# Patient Record
Sex: Female | Born: 1945 | Race: Black or African American | Hispanic: No | Marital: Single | Smoking: Former smoker
Health system: Southern US, Community
[De-identification: ages and names within clinical notes are randomized; demographics above are authoritative.]

## PROBLEM LIST (undated history)

## (undated) DIAGNOSIS — M1990 Unspecified osteoarthritis, unspecified site: Secondary | ICD-10-CM

## (undated) DIAGNOSIS — I10 Essential (primary) hypertension: Secondary | ICD-10-CM

## (undated) DIAGNOSIS — E785 Hyperlipidemia, unspecified: Secondary | ICD-10-CM

## (undated) DIAGNOSIS — D72819 Decreased white blood cell count, unspecified: Secondary | ICD-10-CM

## (undated) DIAGNOSIS — G709 Myoneural disorder, unspecified: Secondary | ICD-10-CM

## (undated) DIAGNOSIS — E079 Disorder of thyroid, unspecified: Secondary | ICD-10-CM

## (undated) HISTORY — DX: Unspecified osteoarthritis, unspecified site: M19.90

## (undated) HISTORY — DX: Myoneural disorder, unspecified: G70.9

## (undated) HISTORY — DX: Decreased white blood cell count, unspecified: D72.819

## (undated) HISTORY — DX: Disorder of thyroid, unspecified: E07.9

## (undated) HISTORY — DX: Hyperlipidemia, unspecified: E78.5

## (undated) HISTORY — DX: Essential (primary) hypertension: I10

---

## 2000-05-22 ENCOUNTER — Other Ambulatory Visit: Admission: RE | Admit: 2000-05-22 | Discharge: 2000-05-22 | Payer: Self-pay | Admitting: Internal Medicine

## 2000-07-27 ENCOUNTER — Encounter: Payer: Self-pay | Admitting: Internal Medicine

## 2000-07-27 ENCOUNTER — Ambulatory Visit (HOSPITAL_COMMUNITY): Admission: RE | Admit: 2000-07-27 | Discharge: 2000-07-27 | Payer: Self-pay | Admitting: Internal Medicine

## 2001-06-07 ENCOUNTER — Emergency Department (HOSPITAL_COMMUNITY): Admission: EM | Admit: 2001-06-07 | Discharge: 2001-06-07 | Payer: Self-pay | Admitting: Emergency Medicine

## 2001-09-05 ENCOUNTER — Ambulatory Visit (HOSPITAL_COMMUNITY): Admission: RE | Admit: 2001-09-05 | Discharge: 2001-09-05 | Payer: Self-pay | Admitting: Gastroenterology

## 2001-09-11 ENCOUNTER — Ambulatory Visit (HOSPITAL_COMMUNITY): Admission: RE | Admit: 2001-09-11 | Discharge: 2001-09-11 | Payer: Self-pay | Admitting: Gastroenterology

## 2001-09-11 ENCOUNTER — Encounter: Payer: Self-pay | Admitting: Gastroenterology

## 2005-06-05 HISTORY — PX: CARPAL TUNNEL RELEASE: SHX101

## 2007-01-15 ENCOUNTER — Ambulatory Visit (HOSPITAL_COMMUNITY): Admission: RE | Admit: 2007-01-15 | Discharge: 2007-01-15 | Payer: Self-pay | Admitting: Internal Medicine

## 2007-05-08 ENCOUNTER — Encounter: Admission: RE | Admit: 2007-05-08 | Discharge: 2007-05-08 | Payer: Self-pay | Admitting: Gastroenterology

## 2007-06-06 HISTORY — PX: KNEE ARTHROSCOPY: SHX127

## 2008-01-18 ENCOUNTER — Emergency Department (HOSPITAL_COMMUNITY): Admission: EM | Admit: 2008-01-18 | Discharge: 2008-01-19 | Payer: Self-pay | Admitting: Emergency Medicine

## 2010-06-25 ENCOUNTER — Encounter: Payer: Self-pay | Admitting: Internal Medicine

## 2010-10-21 NOTE — Procedures (Signed)
[place]  Patient:    Suzanne Neal, Suzanne Neal Visit Number: [id number] MRN: [id number]          Service Type: END Location: ENDO Attending Physician:  Charna Elizabeth Dictated by:   Anselmo Rod, M.D. Proc. Date: 09/05/01 Admit Date:  09/05/2001 Discharge Date: 09/05/2001   CC:         Velna Hatchet, M.D.   Procedure Report  DATE OF BIRTH:  Feb 18, 1946.  PROCEDURE:  Colonoscopy up to the hepatic flexure.  ENDOSCOPIST:  Anselmo Rod, M.D.  INSTRUMENT USED:  Olympus video colonoscope.  INDICATION FOR PROCEDURE:  Rectal bleeding in a 65 year old African-American female.  Rule out colonic polyps, masses, hemorrhoids, etc.  PREPROCEDURE PREPARATION:  Informed consent was procured from the patient. The patient was fasted for eight hours prior to the procedure and prepped with a bottle of magnesium citrate and a gallon of NuLytely the night prior to the procedure.  PREPROCEDURE PHYSICAL:  VITAL SIGNS:  The patient had stable vital signs.  NECK:  Supple.  CHEST:  Clear to auscultation.  S1, S2 regular.  ABDOMEN:  Soft with normal bowel sounds.  DESCRIPTION OF PROCEDURE:  The patient was placed in the left lateral decubitus position and sedated with 50 mg of Demerol and 5 mg of Versed intravenously.  Once the patient was adequately sedate and maintained on low-flow oxygen and continuous cardiac monitoring, the Olympus video colonoscope was advanced from the rectum to the hepatic flexure with extreme difficulty.  The patient had a large amount of residual stool in the colon. Multiple washes were done, the patients position was changed from the left lateral to the supine and then right lateral position to adequately visualize the colonic mucosa but in spite of multiple efforts and gentle application of pressure on the anterior abdominal wall, the scope could not be advanced up to the cecum.  The procedure was aborted after several  attempts to do so.  We plan to do an ACBE.  IMPRESSION: 1. Incomplete colonoscopy secondary to a poor prep and patients body habitus. 2. Prominent internal and external hemorrhoids. 3. No masses or polyps seen. 4. Procedure completed up to the hepatic flexure, no evidence of    diverticulosis. 5. Right colon and cecum not visualized.  RECOMMENDATIONS: 1. Air-contrast barium enema will be scheduled after repeat prep. 2. A high-fiber diet has been recommended. 3. Anusol-HC 2.5% suppositories one p.r. q.h.s., #30 have been prescribed. 4. Outpatient follow-up in the next two weeks. Dictated by:   Anselmo Rod, M.D. Attending Physician:  Charna Elizabeth DD:  09/05/01 TD:  09/05/01 Job: [id number]

## 2010-12-01 ENCOUNTER — Other Ambulatory Visit: Payer: Self-pay | Admitting: Nurse Practitioner

## 2010-12-01 DIAGNOSIS — M542 Cervicalgia: Secondary | ICD-10-CM

## 2010-12-02 ENCOUNTER — Other Ambulatory Visit: Payer: Self-pay | Admitting: Internal Medicine

## 2010-12-02 DIAGNOSIS — M542 Cervicalgia: Secondary | ICD-10-CM

## 2010-12-14 ENCOUNTER — Ambulatory Visit
Admission: RE | Admit: 2010-12-14 | Discharge: 2010-12-14 | Disposition: A | Discharge status: Home / Self Care | Payer: Self-pay | Source: Ambulatory Visit | Attending: Internal Medicine | Admitting: Internal Medicine

## 2010-12-14 DIAGNOSIS — M542 Cervicalgia: Secondary | ICD-10-CM

## 2010-12-14 MED ORDER — GADOBENATE DIMEGLUMINE 529 MG/ML IV SOLN
20.0000 mL | Freq: Once | INTRAVENOUS | Status: AC | PRN
  Administered 2010-12-14: 20 mL via INTRAVENOUS

## 2011-08-09 ENCOUNTER — Telehealth: Payer: Self-pay | Admitting: Hematology and Oncology

## 2011-08-09 NOTE — Telephone Encounter (Signed)
lmonvm for pt on both cell/home phones re calling me for appt w/LO.

## 2011-08-14 ENCOUNTER — Telehealth: Payer: Self-pay | Admitting: Hematology and Oncology

## 2011-08-14 NOTE — Telephone Encounter (Signed)
Referred by Bebe Liter, [facility] Dx-Dec WBC

## 2011-08-16 ENCOUNTER — Telehealth: Payer: Self-pay | Admitting: Hematology and Oncology

## 2011-08-16 ENCOUNTER — Ambulatory Visit (HOSPITAL_BASED_OUTPATIENT_CLINIC_OR_DEPARTMENT_OTHER): Payer: BC Managed Care – PPO | Admitting: Lab

## 2011-08-16 ENCOUNTER — Ambulatory Visit: Payer: Self-pay

## 2011-08-16 ENCOUNTER — Ambulatory Visit (HOSPITAL_BASED_OUTPATIENT_CLINIC_OR_DEPARTMENT_OTHER): Payer: BC Managed Care – PPO | Admitting: Hematology and Oncology

## 2011-08-16 ENCOUNTER — Encounter: Payer: Self-pay | Admitting: Hematology and Oncology

## 2011-08-16 VITALS — BP 142/79 | HR 71 | Temp 97.5°F | Ht 66.5 in | Wt 226.0 lb

## 2011-08-16 DIAGNOSIS — M129 Arthropathy, unspecified: Secondary | ICD-10-CM

## 2011-08-16 DIAGNOSIS — D72819 Decreased white blood cell count, unspecified: Secondary | ICD-10-CM | POA: Insufficient documentation

## 2011-08-16 DIAGNOSIS — M1990 Unspecified osteoarthritis, unspecified site: Secondary | ICD-10-CM

## 2011-08-16 LAB — COMPREHENSIVE METABOLIC PANEL
Alkaline Phosphatase: 65 U/L (ref 39–117)
Creatinine, Ser: 0.88 mg/dL (ref 0.50–1.10)
Glucose, Bld: 96 mg/dL (ref 70–99)
Sodium: 142 mEq/L (ref 135–145)
Total Bilirubin: 0.4 mg/dL (ref 0.3–1.2)
Total Protein: 7.3 g/dL (ref 6.0–8.3)

## 2011-08-16 LAB — CBC WITH DIFFERENTIAL/PLATELET
BASO%: 0.4 % (ref 0.0–2.0)
Eosinophils Absolute: 0.1 10*3/uL (ref 0.0–0.5)
LYMPH%: 34.3 % (ref 14.0–49.7)
MCHC: 32.2 g/dL (ref 31.5–36.0)
MCV: 87.4 fL (ref 79.5–101.0)
MONO%: 7.1 % (ref 0.0–14.0)
NEUT%: 55.6 % (ref 38.4–76.8)
Platelets: 146 10*3/uL (ref 145–400)
RBC: 4.7 10*6/uL (ref 3.70–5.45)

## 2011-08-16 LAB — VITAMIN B12: Vitamin B-12: 486 pg/mL (ref 211–911)

## 2011-08-16 NOTE — Patient Instructions (Signed)
Patient to follow up as instructed.   No current outpatient prescriptions on file.        March 2013  Sunday Monday Tuesday Wednesday Thursday Friday Saturday                  1   2    3   4   5   6   7   8   9    10   11   12   13    FINANCIAL COUNSELING   1:00 PM  (30 min.)  [facility] Artist  [facility]   NEW PATIENT 60   1:30 PM  (60 min.)  Laurice Record, MD  [facility]COL[facility]   LAB ADDON   1:45 PM  (15 min.)  Delcie Roch  [facility]COL[facility] 14   15   16    17   18   19   20   21   22   23    24   25   26   27   28   29   30     31

## 2011-08-16 NOTE — Telephone Encounter (Signed)
appt made and printed for 05/08/12

## 2011-08-16 NOTE — Progress Notes (Signed)
This office note has been dictated.

## 2011-08-16 NOTE — Progress Notes (Signed)
Dr.     Maggie Font       -     Primary. Dr/    Nadyne Coombes      -       Opthalmologist.  [facility]   On   [address].  Cell     Phone       [phone].

## 2011-08-16 NOTE — Progress Notes (Signed)
CC:   Robyn N. Allyne Gee, M.D.  IDENTIFYING STATEMENT:  The patient is a 66 year old woman seen at the request of Dr. Allyne Gee with leukopenia.  HISTORY OF PRESENT ILLNESS:  The patient notes that during a recent physical, she was felt to be leukopenic.  She in general had been in very good health except for arthritis involving her neck and knees. She is status post left arthroscopic knee surgery.  She denied history of fever, chills, or night sweats.  She denies adenopathy.  Her recent thyroid function testing was unremarkable.  She has not lost any weight. She reports eating a well-balanced diet.  She denies family history for leukopenia.  She is of African American descent.  We reviewed recent labs from Dr. Allyne Gee office, which notes:  07/05/2011 white cell count 2.9, hemoglobin 13.4, hematocrit 41.5, platelets 170; 07/11/2011 white cell count 3.5, hemoglobin 13.1, hematocrit 40.7, platelets 169.  PAST MEDICAL HISTORY:  Asthma, depression, arthritis, status post left arthroscopic knee surgery.  ALLERGIES:  None.  MEDICATIONS:  Albuterol MDI p.r.n., albuterol nebs p.r.n., Lexapro 10 mg daily, ibuprofen 800 mg 2 to 3 a week, aspirin 81 mg every other day, omega-3 1000 mg daily, co-enzyme Q 500 mg daily, green coffee.  SOCIAL HISTORY:  The patient is divorced with 3 children.  She denies alcohol or tobacco use.  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  FAMILY HISTORY:  Father had bladder cancer.  There is no family history for leukopenia.  She denies hematological malignancies.  HEALTH MAINTENANCE:  Is due for colonoscopy and mammograms.  REVIEW OF SYSTEMS:  As above, but denies fever, chills, night sweats, anorexia, weight loss.  Musculoskeletal:  Generalized joint aches, especially in the neck and knee.  Cardiovascular:  Denies chest pain, PND, orthopnea, ankle swelling.  Respirations:  Gets short of breath on extreme exertion with wheeze, is asthmatic.  Denies cough or  hemoptysis. GI:  Denies nausea, vomiting, abdominal pain, diarrhea, melena, hematochezia.  GU:  Denies dysuria, hematuria, nocturia, frequency. Skin:  No bruising or bleeding.  Neurologic:  Denies headaches, vision change, extremity weakness.  PHYSICAL EXAMINATION:  General:  The patient is a well-appearing, well- nourished woman in no current distress.  Vitals:  Pulse 71, blood pressure 142/79, temperature 97.5, respirations 20, weight 226 pounds. HEENT:  Head is atraumatic, normocephalic.  Sclerae are anicteric. Mouth moist.  Neck:  Supple.  Chest:  Clear.  CVS:  Unremarkable. Abdomen:  Soft and nontender.  Bowel sounds present.  Extremities:  No edema.  Lymph Nodes:  No adenopathy.  CNS:  Nonfocal.  IMPRESSION AND PLAN:  The patient is a healthy 66 year old woman evaluated for leukopenia.  I had the patient return to the lab to have CBC repeated and white cell count was 3.8, hemoglobin 13.2, hematocrit 41, platelets 146, and ANC was 2100.  Differential was unremarkable. Morphology was also unremarkable.  Patient was reassured and was told that she had adequate neutrophils.  It is probable that her lowish white cell count of a few weeks ago was probably due to margination.  She is of African American descent.  She also has a background history of arthritis, which can often cause leukopenia.  We also discussed the requirement for a well- balanced diet.  I will also check a vitamin B12 level and call her with results.  We also discussed the need for a colonoscopy and mammogram, which she plans to schedule in the upcoming weeks.  I spent more than half the time  in discussion and coordination of care with plans for her to follow up with a CBC at the end of the year or sooner if needed.    ______________________________ Laurice Record, M.D. LIO/MEDQ  D:  08/16/2011  T:  08/16/2011  Job:  [id number]

## 2011-08-17 ENCOUNTER — Telehealth: Payer: Self-pay | Admitting: *Deleted

## 2011-08-17 ENCOUNTER — Other Ambulatory Visit: Payer: Self-pay | Admitting: Internal Medicine

## 2011-08-17 DIAGNOSIS — Z1231 Encounter for screening mammogram for malignant neoplasm of breast: Secondary | ICD-10-CM

## 2011-08-17 NOTE — Telephone Encounter (Signed)
Spoke with pt on cell phone and informed pt re:   Vit B12 level  OK  At  486  As per md's instructions.

## 2011-08-25 ENCOUNTER — Ambulatory Visit
Admission: RE | Admit: 2011-08-25 | Discharge: 2011-08-25 | Disposition: A | Discharge status: Home / Self Care | Payer: BC Managed Care – PPO | Source: Ambulatory Visit | Attending: Internal Medicine | Admitting: Internal Medicine

## 2011-08-25 DIAGNOSIS — Z1231 Encounter for screening mammogram for malignant neoplasm of breast: Secondary | ICD-10-CM

## 2012-05-07 ENCOUNTER — Telehealth: Payer: Self-pay | Admitting: *Deleted

## 2012-05-07 ENCOUNTER — Other Ambulatory Visit: Payer: Self-pay | Admitting: *Deleted

## 2012-05-07 NOTE — Telephone Encounter (Signed)
Pt called and left message re:  Pt will not be able to keep f/u appt on 05/08/12 due to obligation at work.  Pt would like to reschedule f/u appt to any days but Wednesdays due to work schedule. Pt's  Work  IT sales professional   ;    [employer]    [phone].

## 2012-05-07 NOTE — Telephone Encounter (Signed)
Left voice message on [phone] to inform the patient that the follow up has been changed to 06-07-2012 starting at 2:15pm

## 2012-05-08 ENCOUNTER — Ambulatory Visit: Payer: BC Managed Care – PPO | Admitting: Hematology and Oncology

## 2012-05-08 ENCOUNTER — Other Ambulatory Visit: Payer: BC Managed Care – PPO | Admitting: Lab

## 2012-05-25 ENCOUNTER — Telehealth: Payer: Self-pay | Admitting: Oncology

## 2012-05-25 NOTE — Telephone Encounter (Signed)
S/w the pt and she is aware of her change in the physican from dr odogwu to dr Arline Asp due to the md is leaving the practice. She is aware of her new revised appt d/t.

## 2012-06-03 ENCOUNTER — Other Ambulatory Visit: Payer: Self-pay | Admitting: Oncology

## 2012-06-07 ENCOUNTER — Ambulatory Visit (HOSPITAL_BASED_OUTPATIENT_CLINIC_OR_DEPARTMENT_OTHER): Payer: BC Managed Care – PPO | Admitting: Family

## 2012-06-07 ENCOUNTER — Other Ambulatory Visit (HOSPITAL_BASED_OUTPATIENT_CLINIC_OR_DEPARTMENT_OTHER): Payer: BC Managed Care – PPO | Admitting: Lab

## 2012-06-07 ENCOUNTER — Ambulatory Visit: Payer: BC Managed Care – PPO | Admitting: Hematology and Oncology

## 2012-06-07 ENCOUNTER — Encounter: Payer: Self-pay | Admitting: Family

## 2012-06-07 ENCOUNTER — Telehealth: Payer: Self-pay | Admitting: Oncology

## 2012-06-07 VITALS — BP 135/75 | HR 78 | Temp 96.9°F | Resp 20 | Ht 66.5 in | Wt 219.1 lb

## 2012-06-07 DIAGNOSIS — D72819 Decreased white blood cell count, unspecified: Secondary | ICD-10-CM

## 2012-06-07 LAB — CBC WITH DIFFERENTIAL/PLATELET
Basophils Absolute: 0.1 10*3/uL (ref 0.0–0.1)
EOS%: 2.3 % (ref 0.0–7.0)
Eosinophils Absolute: 0.1 10*3/uL (ref 0.0–0.5)
HCT: 39.9 % (ref 34.8–46.6)
HGB: 13.1 g/dL (ref 11.6–15.9)
MCH: 28.8 pg (ref 25.1–34.0)
MONO#: 0.3 10*3/uL (ref 0.1–0.9)
NEUT#: 2.9 10*3/uL (ref 1.5–6.5)
NEUT%: 55.5 % (ref 38.4–76.8)
lymph#: 1.8 10*3/uL (ref 0.9–3.3)

## 2012-06-07 LAB — BASIC METABOLIC PANEL (CC13)
BUN: 14 mg/dL (ref 7.0–26.0)
CO2: 29 mEq/L (ref 22–29)
Chloride: 106 mEq/L (ref 98–107)
Creatinine: 0.9 mg/dL (ref 0.6–1.1)
Glucose: 109 mg/dl — ABNORMAL HIGH (ref 70–99)
Potassium: 4 mEq/L (ref 3.5–5.1)

## 2012-06-07 NOTE — Telephone Encounter (Signed)
Gave pt appt for June 2014 lab and MD °

## 2012-06-07 NOTE — Patient Instructions (Addendum)
Please contact us at [phone] if you have any questions or concerns.  Please schedule and complete colonoscopy before next office visit.  Results for orders placed in visit on 06/07/12 (from the past 24 hour(s))  CBC WITH DIFFERENTIAL     Status: Normal   Collection Time   06/07/12  3:25 PM      Component Value Range   WBC 5.2  3.9 - 10.3 10e3/uL   NEUT# 2.9  1.5 - 6.5 10e3/uL   HGB 13.1  11.6 - 15.9 g/dL   HCT 40.9  81.1 - 91.4 %   Platelets 148  145 - 400 10e3/uL   MCV 87.6  79.5 - 101.0 fL   MCH 28.8  25.1 - 34.0 pg   MCHC 32.8  31.5 - 36.0 g/dL   RBC 7.82  9.56 - 2.13 10e6/uL   RDW 13.5  11.2 - 14.5 %   lymph# 1.8  0.9 - 3.3 10e3/uL   MONO# 0.3  0.1 - 0.9 10e3/uL   Eosinophils Absolute 0.1  0.0 - 0.5 10e3/uL   Basophils Absolute 0.1  0.0 - 0.1 10e3/uL   NEUT% 55.5  38.4 - 76.8 %   LYMPH% 34.8  14.0 - 49.7 %   MONO% 6.3  0.0 - 14.0 %   EOS% 2.3  0.0 - 7.0 %   BASO% 1.1  0.0 - 2.0 %   Narrative:    Performed At:  [facility]               [address]               [place], [place] [address]  BASIC METABOLIC PANEL (CC13)     Status: Abnormal   Collection Time   06/07/12  3:25 PM      Component Value Range   Sodium 145  136 - 145 mEq/L   Potassium 4.0  3.5 - 5.1 mEq/L   Chloride 106  98 - 107 mEq/L   CO2 29  22 - 29 mEq/L   Glucose 109 (*) 70 - 99 mg/dl   BUN 84.6  7.0 - 96.2 mg/dL   Creatinine 0.9  0.6 - 1.1 mg/dL   Calcium 9.1  8.4 - 95.2 mg/dL   Narrative:    Performed At:  [facility]               [address]               [place], [place] [address]

## 2012-06-07 NOTE — Progress Notes (Signed)
Patient ID: Suzanne Neal, female   DOB: [birth year], 67 y.o.   MRN: [id number] CSN: [id number]  CC: Robyn N. Allyne Gee, M.D.   Problem List: Suzanne Neal is a 67 y.o. African-American female with a problem list consisting of:  1.  Leukopenia - diagnosed in March 2013 2.  Arthritis 3.  Asthma 4.  Depression 5.  S/p left knee arthroscopy  Suzanne Neal is a 67 year-old [place] American female who presents today for follow-up of a history of leukopenia.  Dr. Dalene Carrow originally saw this patient on 08/16/2011 at the request of Dr. Allyne Gee for leukopenia.  Since her last office visit on 08/16/2011, the patient states that she has been doing well.  She stated that she had a sore throat, post-nasal drip and bilateral swollen cervical lymph nodes around 05/24/2012.  Suzanne Neal stated that she took garlic in excess during the times of her symptoms and her symptoms resolved in one day.  It was requested by Dr. Dalene Carrow during their last office visit that the patient obtain a mammogram and a colonoscopy.  The patient did obtain a mammogram on 08/25/2011 (no mammographic evidence of malignancy), but has not contacted anyone about obtaining a colonoscopy.   Her last colonoscopy was on 09/11/2001. The patient denies any other symptomatology and states that she feels great.  She is very active in line dancing classes and she is a Public house manager at [employer].   Past Medical History: Past Medical History  Diagnosis Date  . Leukopenia     Surgical History: Past Surgical History  Procedure Date  . Knee arthroscopy 2009    Left knee  . Carpal tunnel release 2007    Bilaterally    Current Medications: Current Outpatient Prescriptions  Medication Sig Dispense Refill  . albuterol (PROVENTIL HFA;VENTOLIN HFA) 108 (90 BASE) MCG/ACT inhaler Inhale 1 puff into the lungs as needed.      Marland Kitchen albuterol (PROVENTIL) (2.5 MG/3ML) 0.083% nebulizer solution Take 2.5 mg by nebulization as needed.      Marland Kitchen aspirin 81 MG tablet Take 81  mg by mouth every other day.      Marland Kitchen co-enzyme Q-10 50 MG capsule Take 50 mg by mouth daily.      Marland Kitchen escitalopram (LEXAPRO) 10 MG tablet Take 10 mg by mouth daily.      . folic acid (FOLVITE) 1 MG tablet Take 1 mg by mouth daily.      Marland Kitchen ibuprofen (ADVIL,MOTRIN) 800 MG tablet Take 800 mg by mouth as directed. Take  2 - 3  Times per week.      . Linaclotide (LINZESS) 145 MCG CAPS Take 145 mcg by mouth once a week.      . omega-3 acid ethyl esters (LOVAZA) 1 G capsule Take 1 g by mouth daily.      Marland Kitchen OVER THE COUNTER MEDICATION Take by mouth daily. Mangosteen  Juice.      . rosuvastatin (CRESTOR) 10 MG tablet Take 10 mg by mouth daily.      . Vitamin D, Ergocalciferol, (DRISDOL) 50000 UNITS CAPS Take 50,000 Units by mouth 2 (two) times a week.      . Zinc 50 MG CAPS Take 50 mg by mouth daily.        Allergies: No Known Allergies  Family History: Family History  Problem Relation Age of Onset  . Diabetes Mother   . Dementia Mother   . Sarcoidosis Maternal Grandmother   . Rheum arthritis Maternal Grandmother  Social History: History  Substance Use Topics  . Smoking status: Former Smoker -- 29 years    Types: Cigarettes    Quit date: 08/16/1983  . Smokeless tobacco: Never Used  . Alcohol Use: No    Review of Systems: 10 Point review of systems was completed and is negative except as noted above.   Physical Exam:   Blood pressure 135/75, pulse 78, temperature 96.9 F (36.1 C), temperature source Oral, resp. rate 20, height 5' 6.5" (1.689 m), weight 219 lb 1.6 oz (99.383 kg).  General appearance: Alert, cooperative, well nourished, no apparent distress Head: Normocephalic, without obvious abnormality, atraumatic Eyes: Conjunctivae/corneas clear,  PERRLA, EOMI Nose: Nares, septum and mucosa are normal, no drainage or sinus tenderness Neck: No adenopathy, supple, symmetrical, trachea midline, thyroid not enlarged, no tenderness Resp: Clear to auscultation bilaterally, diminished  bibasilar breath sounds Cardio: Regular rate and rhythm, S1, S2 normal, no murmur, click, rub or gallop GI: Soft, distended, non-tender, hypoactive bowel sounds, no organomegaly Extremities: Extremities normal, atraumatic, no cyanosis or edema Lymph nodes: Cervical, supraclavicular, and axillary nodes normal Neurologic: Grossly normal   Laboratory Data: Results for orders placed in visit on 06/07/12 (from the past 48 hour(s))  CBC WITH DIFFERENTIAL     Status: Normal   Collection Time   06/07/12  3:25 PM      Component Value Range Comment   WBC 5.2  3.9 - 10.3 10e3/uL    NEUT# 2.9  1.5 - 6.5 10e3/uL    HGB 13.1  11.6 - 15.9 g/dL    HCT 16.1  09.6 - 04.5 %    Platelets 148  145 - 400 10e3/uL    MCV 87.6  79.5 - 101.0 fL    MCH 28.8  25.1 - 34.0 pg    MCHC 32.8  31.5 - 36.0 g/dL    RBC 4.09  8.11 - 9.14 10e6/uL    RDW 13.5  11.2 - 14.5 %    lymph# 1.8  0.9 - 3.3 10e3/uL    MONO# 0.3  0.1 - 0.9 10e3/uL    Eosinophils Absolute 0.1  0.0 - 0.5 10e3/uL    Basophils Absolute 0.1  0.0 - 0.1 10e3/uL    NEUT% 55.5  38.4 - 76.8 %    LYMPH% 34.8  14.0 - 49.7 %    MONO% 6.3  0.0 - 14.0 %    EOS% 2.3  0.0 - 7.0 %    BASO% 1.1  0.0 - 2.0 %   BASIC METABOLIC PANEL (CC13)     Status: Abnormal   Collection Time   06/07/12  3:25 PM      Component Value Range Comment   Sodium 145  136 - 145 mEq/L    Potassium 4.0  3.5 - 5.1 mEq/L    Chloride 106  98 - 107 mEq/L    CO2 29  22 - 29 mEq/L    Glucose 109 (*) 70 - 99 mg/dl    BUN 78.2  7.0 - 95.6 mg/dL    Creatinine 0.9  0.6 - 1.1 mg/dL    Calcium 9.1  8.4 - 21.3 mg/dL      Imaging Studies: 1.  Digital screening bilateral mammogram on 08/25/3011 showed no specific mammographic evidence of malignancy. 2.  MRI of the cervical spine w/wo contrast on 12/14/2010 showed cervical spondylosis and degenerative disc disease with varying  degrees of impingement at all levels between C3 and T1. Suspected hemangioma eccentric to the right in the C4 vertebral  body. 3.  Colonoscopy w/wo KUB on 09/11/2001 showed no persistent polypoid, ulcerative or constricting lesions are detected. Barium refluxed into the distal ileum which appears normal. The preliminary scout film of the abdomen is negative. Negative BE.   Impression/Plan: Suzanne Neal is a healthy 67 year old African-American woman who was evaluated for leukopenia. Her differential and morphology was unremarkable.   We will plan to see Suzanne Neal again in 6 months for an office visit with Dr. Arline Asp and laboratories (CMP, CBC and LDH) on 11/07/2012.  Interim blood counts will be omitted at this time.  Suzanne Neal is encouraged to contact us in the interim if she has any questions or concerns.    Larina Bras, NP-C 06/07/2012, 5:37 PM

## 2012-11-07 ENCOUNTER — Other Ambulatory Visit: Payer: BC Managed Care – PPO | Admitting: Lab

## 2012-11-07 ENCOUNTER — Ambulatory Visit: Payer: BC Managed Care – PPO | Admitting: Oncology

## 2013-03-06 ENCOUNTER — Ambulatory Visit
Admission: RE | Admit: 2013-03-06 | Discharge: 2013-03-06 | Disposition: A | Discharge status: Home / Self Care | Payer: BC Managed Care – PPO | Source: Ambulatory Visit | Attending: Nurse Practitioner | Admitting: Nurse Practitioner

## 2013-03-06 ENCOUNTER — Other Ambulatory Visit: Payer: Self-pay | Admitting: Nurse Practitioner

## 2013-03-06 DIAGNOSIS — N6452 Nipple discharge: Secondary | ICD-10-CM

## 2013-03-19 ENCOUNTER — Other Ambulatory Visit: Payer: BC Managed Care – PPO | Admitting: Sports Medicine

## 2013-04-01 ENCOUNTER — Encounter: Payer: Self-pay | Admitting: Family Medicine

## 2013-04-01 ENCOUNTER — Ambulatory Visit (INDEPENDENT_AMBULATORY_CARE_PROVIDER_SITE_OTHER): Payer: BC Managed Care – PPO | Admitting: Family Medicine

## 2013-04-01 VITALS — BP 148/84 | Ht 66.0 in | Wt 200.0 lb

## 2013-04-01 DIAGNOSIS — M7671 Peroneal tendinitis, right leg: Secondary | ICD-10-CM

## 2013-04-01 DIAGNOSIS — M7750 Other enthesopathy of unspecified foot: Secondary | ICD-10-CM

## 2013-04-01 MED ORDER — NITROGLYCERIN 0.2 MG/HR TD PT24: 1.0000 | MEDICATED_PATCH | Freq: Every day | TRANSDERMAL | Status: DC

## 2013-04-01 NOTE — Progress Notes (Signed)
CC: Right lateral foot pain HPI: Patient is a 67 year old female who I previously saw at [facility] for evaluation of persistent bilateral foot pain. Unfortunately, she suffered a avulsion fracture off the base of the fifth metatarsal back in March. She was treated in an orthopedic office for this and was in a walker boot for about a month. She was released from their care after full healing of the fracture but has had persistent pain at the area of the base of the fifth since that time. She describes the pain as burning type of pain that radiates along her fifth metatarsal. She has tried Motrin, Aleve, and Tylenol without significant relief of her symptoms. She does sometimes wear the boot at work when she is having more pain and this does help with her symptoms. She does continue to dance.  ROS: As above in the HPI. All other systems are stable or negative.  PMH: Asthma  PSH: None  Social: Patient works as a Engineer, civil (consulting) at [employer]. She does not smoke. Family: Family history positive for diabetes in her mother. Allergies: No known drug allergies    OBJECTIVE: APPEARANCE:  Patient in no acute distress.The patient appeared well nourished and normally developed. HEENT: No scleral icterus. Conjunctiva non-injected Resp: Non labored Skin: No rash MSK:  Right Foot exam:  - On inspection, there is noted to be no swelling or deformity.  - Gait is antalgic - There is tenderness to palpation over the base of the fifth metatarsal but no other tenderness to palpation.  - Ankle range of motion is full without pain. - Strength is 5 out of 5 in ankle and foot.  - Neurovascular status normal.   MSK [place]: Limited ultrasound of the right lateral foot and ankle was performed in transverse and longitudinal views. At the level of the ankle and lateral malleolus there was hypoechoic signal around the peroneal tendons consistent with tendinopathy. At the distal attachment of the peroneal tendon to the days  of the fifth metatarsal there was noted to be hyperechoic calcifications as well as hypoechoic fluid and possible partial tear at the insertion   ASSESSMENT: #1. Persistent right lateral foot pain following a avulsion type fracture at the base of the fifth metatarsal with ultrasound findings today suggestive of peroneal tendon injury at the insertion  PLAN: We will start the patient on nitroglycerin protocol. She was given a compression sleeve to wear. We have asked her to take the next month off of dancing while she recovers. She was also given some heel raises to do with toes turned out to isolate the peroneal muscles. I did explain to her that this has been a chronic problem for her over several months and I expect that the resolution will also be slow. I've encouraged her to be patient and expect a 3-6 month recovery period. I am hopeful that when we next see her back she will have some degree of improvement in her symptoms. Followup in 4 weeks.

## 2013-04-01 NOTE — Patient Instructions (Addendum)
Thank you for coming in today  You have tendinopathy of the peroneal tendon where it attaches to the base of the 5th metatarsal Try nitroglycerin patch as below Do heel raises with toes turned in No dancing for 1 month - cross train with bike, swimming, elliptical Consider compression  Nitroglycerin Protocol   Apply 1/4 nitroglycerin patch to affected area daily.  Change position of patch within the affected area every 24 hours.  You may experience a headache during the first 1-2 weeks of using the patch, these should subside.  If you experience headaches after beginning nitroglycerin patch treatment, you may take your preferred over the counter pain reliever.  Another side effect of the nitroglycerin patch is skin irritation or rash related to patch adhesive.  Please notify our office if you develop more severe headaches or rash, and stop the patch.  Tendon healing with nitroglycerin patch may require 12 to 24 weeks depending on the extent of injury.  Men should not use if taking Viagra, Cialis, or Levitra.   Do not use if you have migraines or rosacea.

## 2013-05-13 ENCOUNTER — Ambulatory Visit: Payer: BC Managed Care – PPO | Admitting: Family Medicine

## 2014-03-05 ENCOUNTER — Other Ambulatory Visit: Payer: Self-pay

## 2014-03-05 DIAGNOSIS — Z1239 Encounter for other screening for malignant neoplasm of breast: Secondary | ICD-10-CM

## 2014-03-25 ENCOUNTER — Other Ambulatory Visit: Payer: Self-pay

## 2014-03-25 DIAGNOSIS — Z1231 Encounter for screening mammogram for malignant neoplasm of breast: Secondary | ICD-10-CM

## 2014-03-27 ENCOUNTER — Ambulatory Visit: Payer: BC Managed Care – PPO

## 2014-06-24 ENCOUNTER — Emergency Department (HOSPITAL_COMMUNITY): Payer: BC Managed Care – PPO

## 2014-06-24 ENCOUNTER — Emergency Department (HOSPITAL_COMMUNITY)
Admission: EM | Admit: 2014-06-24 | Discharge: 2014-06-24 | Disposition: A | Discharge status: Home / Self Care | Payer: BC Managed Care – PPO | Attending: Emergency Medicine | Admitting: Emergency Medicine

## 2014-06-24 ENCOUNTER — Encounter (HOSPITAL_COMMUNITY): Payer: Self-pay | Admitting: Emergency Medicine

## 2014-06-24 DIAGNOSIS — Z87891 Personal history of nicotine dependence: Secondary | ICD-10-CM | POA: Diagnosis not present

## 2014-06-24 DIAGNOSIS — R079 Chest pain, unspecified: Secondary | ICD-10-CM

## 2014-06-24 DIAGNOSIS — Z862 Personal history of diseases of the blood and blood-forming organs and certain disorders involving the immune mechanism: Secondary | ICD-10-CM | POA: Insufficient documentation

## 2014-06-24 DIAGNOSIS — J45901 Unspecified asthma with (acute) exacerbation: Secondary | ICD-10-CM | POA: Insufficient documentation

## 2014-06-24 DIAGNOSIS — Z79899 Other long term (current) drug therapy: Secondary | ICD-10-CM | POA: Insufficient documentation

## 2014-06-24 DIAGNOSIS — Z8639 Personal history of other endocrine, nutritional and metabolic disease: Secondary | ICD-10-CM | POA: Insufficient documentation

## 2014-06-24 LAB — BASIC METABOLIC PANEL
Anion gap: 9 (ref 5–15)
BUN: 9 mg/dL (ref 6–23)
CO2: 27 mmol/L (ref 19–32)
Calcium: 8.8 mg/dL (ref 8.4–10.5)
Chloride: 106 mEq/L (ref 96–112)
Creatinine, Ser: 0.75 mg/dL (ref 0.50–1.10)
GFR calc Af Amer: >90 mL/min (ref >90)
GFR, EST NON AFRICAN AMERICAN: 86 mL/min — AB (ref >90)
GLUCOSE: 93 mg/dL (ref 70–99)
Potassium: 4.2 mmol/L (ref 3.5–5.1)
Sodium: 142 mmol/L (ref 135–145)

## 2014-06-24 LAB — CBC
HCT: 39.5 % (ref 36.0–46.0)
Hemoglobin: 12.4 g/dL (ref 12.0–15.0)
MCH: 27.9 pg (ref 26.0–34.0)
MCHC: 31.4 g/dL (ref 30.0–36.0)
MCV: 88.8 fL (ref 78.0–100.0)
Platelets: 140 K/uL — ABNORMAL LOW (ref 150–400)
RBC: 4.45 MIL/uL (ref 3.87–5.11)
RDW: 12.8 % (ref 11.5–15.5)
WBC: 3.5 K/uL — ABNORMAL LOW (ref 4.0–10.5)

## 2014-06-24 LAB — I-STAT TROPONIN, ED
TROPONIN I, POC: 0 ng/mL (ref 0.00–0.08)
Troponin i, poc: 0 ng/mL (ref 0.00–0.08)

## 2014-06-24 MED ORDER — OMEPRAZOLE 20 MG PO CPDR: 20.0000 mg | DELAYED_RELEASE_CAPSULE | Freq: Every day | ORAL | Status: DC

## 2014-06-24 NOTE — ED Notes (Signed)
Per [facility], pt woke up this morning with bad headache, took some alka seltzer, headache got better but started having CP, worse when ambulating, relieved at rest. 12 lead unremarkable. 324 asa 1 nitro by staff at work. Pain is relieved. No pain at this time. VSS.

## 2014-06-24 NOTE — ED Notes (Signed)
Pt transported to CT ?

## 2014-06-24 NOTE — ED Notes (Signed)
CP is in center of chest, no associated symptoms. No radiating. Described as sharp. No pain at this time.

## 2014-06-24 NOTE — ED Notes (Signed)
Pt returned from xray

## 2014-06-24 NOTE — ED Notes (Signed)
Pt ambulated to bathroom with steady gait. 

## 2014-06-24 NOTE — ED Provider Notes (Signed)
CSN: [id number]     Arrival date & time 06/24/14  1309 History   First MD Initiated Contact with Patient 06/24/14 1327     Chief Complaint  Patient presents with  . Chest Pain     (Consider location/radiation/quality/duration/timing/severity/associated sxs/prior Treatment) HPI  Suzanne Neal is a 69 y.o. female with PMH of leukopenia, dyslipidemia presenting with chest pain that started at 11:30 today. Pain is in mid chest without any radiation and is described as sharp. Patient was ambulating which worsen the pain. Chest pain relieved with rest. Patient did take 325 aspirin as well as one nitroglycerin at work which relieved the pain as well. Patient denies pain at this time. Patient denies nausea, vomiting, diaphoresis. Patient with history of asthma and reports immediate shortness of breath as well as lightheadedness that is now resolved. Patient states his shortness of breath was not as bad as asthma she's had in the past. Patient without cardiac history. No diabetes, hypertension. Patient former smoker. She denies history of DVT, PE, malignancy, recent trauma, surgery, hemoptysis.   Past Medical History  Diagnosis Date  . Leukopenia    Past Surgical History  Procedure Laterality Date  . Knee arthroscopy  2009    Left knee  . Carpal tunnel release  2007    Bilaterally   Family History  Problem Relation Age of Onset  . Diabetes Mother   . Dementia Mother   . Sarcoidosis Maternal Grandmother   . Rheum arthritis Maternal Grandmother    History  Substance Use Topics  . Smoking status: Former Smoker -- 29 years    Types: Cigarettes    Quit date: 08/16/1983  . Smokeless tobacco: Never Used  . Alcohol Use: No   OB History    No data available     Review of Systems 10 Systems reviewed and are negative for acute change except as noted in the HPI.    Allergies  Review of patient's allergies indicates no known allergies.  Home Medications   Prior to Admission medications    Medication Sig Start Date End Date Taking? Authorizing Provider  BEE POLLEN PO Take 1 tablet by mouth daily.   Yes Historical Provider, MD  chlorhexidine (PERIDEX) 0.12 % solution Use as directed 15 mLs in the mouth or throat as needed (for canker sore).   Yes Historical Provider, MD  escitalopram (LEXAPRO) 10 MG tablet Take 10 mg by mouth daily.   Yes Historical Provider, MD  fluticasone (FLONASE) 50 MCG/ACT nasal spray Place 1 spray into both nostrils as needed for allergies or rhinitis.   Yes Historical Provider, MD  Fluticasone-Salmeterol (ADVAIR) 100-50 MCG/DOSE AEPB Inhale 1 puff into the lungs as needed (for shortness of breath).   Yes Historical Provider, MD  ibuprofen (ADVIL,MOTRIN) 800 MG tablet Take 800 mg by mouth as directed. Take  2 - 3  Times per week.   Yes Historical Provider, MD  Linaclotide (LINZESS) 145 MCG CAPS Take 145 mcg by mouth once a week.   Yes Historical Provider, MD  Methylsulfonylmethane (MSM PO) Take 1 tablet by mouth as needed (for joint health).   Yes Historical Provider, MD  OVER THE COUNTER MEDICATION Take 1 tablet by mouth as needed ("Sleepsure" for sleep).   Yes Historical Provider, MD  albuterol (PROVENTIL HFA;VENTOLIN HFA) 108 (90 BASE) MCG/ACT inhaler Inhale 1 puff into the lungs as needed.    Historical Provider, MD  albuterol (PROVENTIL) (2.5 MG/3ML) 0.083% nebulizer solution Take 2.5 mg by nebulization as needed.  Historical Provider, MD  nitroGLYCERIN (NITRODUR - DOSED IN MG/24 HR) 0.2 mg/hr patch Place 1 patch (0.2 mg total) onto the skin daily. 04/01/13   Daine GipErin L Voss, MD   BP 116/59 mmHg  Pulse 69  Temp(Src) 98.3 F (36.8 C) (Oral)  Resp 19  SpO2 100% Physical Exam  Constitutional: She appears well-developed and well-nourished. No distress.  HENT:  Head: Normocephalic and atraumatic.  Eyes: Conjunctivae and EOM are normal. Right eye exhibits no discharge. Left eye exhibits no discharge.  Neck: No JVD present.  Cardiovascular: Normal rate,  regular rhythm and normal heart sounds.   No leg swelling or tenderness. Negative Homan's sign.  Pulmonary/Chest: Effort normal and breath sounds normal. No respiratory distress. She has no wheezes.  Abdominal: Soft. Bowel sounds are normal. She exhibits no distension. There is no tenderness.  Neurological: She is alert. She exhibits normal muscle tone. Coordination normal.  Skin: Skin is warm and dry. She is not diaphoretic.  Nursing note and vitals reviewed.   ED Course  Procedures (including critical care time) Labs Review Labs Reviewed  CBC - Abnormal; Notable for the following:    WBC 3.5 (*)    Platelets 140 (*)    All other components within normal limits  BASIC METABOLIC PANEL - Abnormal; Notable for the following:    GFR calc non Af Amer 86 (*)    All other components within normal limits  I-STAT TROPOININ, ED    Imaging Review Dg Chest 2 View  06/24/2014   CLINICAL DATA:  Midsternal chest pain for 1 day  EXAM: CHEST  2 VIEW  COMPARISON:  None.  FINDINGS: There is subsegmental atelectasis in the left base. Elsewhere, lungs are clear. Heart is upper normal in size with pulmonary vascularity within normal limits. No adenopathy. No bone lesions.  IMPRESSION: Mild left base atelectasis.  No edema or consolidation.   Electronically Signed   By: Bretta BangWilliam  Woodruff M.D.   On: 06/24/2014 14:03     EKG Interpretation   Date/Time:  Wednesday June 24 2014 13:12:11 EST Ventricular Rate:  72 PR Interval:  190 QRS Duration: 79 QT Interval:  406 QTC Calculation: 444 R Axis:   49 Text Interpretation:  Sinus rhythm Low voltage, precordial leads No  significant change was found Confirmed by CAMPOS  MD, KEVIN ([id number]) on  06/24/2014 3:27:09 PM      EKG Interpretation  Date/Time:  Wednesday June 24 2014 15:45:11 EST Ventricular Rate:  67 PR Interval:  168 QRS Duration: 81 QT Interval:  412 QTC Calculation: 435 R Axis:   49 Text Interpretation:  Sinus rhythm No significant  change was found Confirmed by CAMPOS  MD, Caryn BeeKEVIN ([id number]) on 06/24/2014 3:51:02 PM          MDM   Final diagnoses:  Chest pain   Chest pain is not likely of cardiac or pulmonary etiology d/t presentation, low risk Well's score, no current SOB, hypoxia, tachycardia, low risk HEART score. VSS, no JVD or new murmur, RRR, breath sounds equal bilaterally, EKG and repeat EKG with Sinus rhythm low voltage, negative troponin and delta troponin, and negative CXR. Pt has been advised to return to the ED if CP becomes exertional, associated with diaphoresis or nausea, radiates to left jaw/arm, worsens or becomes concerning in any way. Patient started on PPI. Pt to follow up with cardiology and follow up with PCP. Pt appears reliable for follow up and is agreeable to discharge.   Discussed return precautions with patient. Discussed  all results and patient verbalizes understanding and agrees with plan.  This is a shared patient. This patient was discussed with the physician, Dr. Patria Mane who saw and evaluated the patient and agrees with the plan.   Louann Sjogren, PA-C 06/24/14 1659  Lyanne Co, MD 06/25/14 [phone]

## 2014-06-24 NOTE — Discharge Instructions (Signed)
Return to the emergency room with worsening of symptoms, new symptoms or with symptoms that are concerning , especially chest pain that feels like a pressure, spreads to left arm or jaw, worse with exertion, associated with nausea, vomiting, shortness of breath and/or sweating.  Call to make an appointment with cardiology for follow up  Please call your doctor for a followup appointment within 24-48 hours. When you talk to your doctor please let them know that you were seen in the emergency department and have them acquire all of your records so that they can discuss the findings with you and formulate a treatment plan to fully care for your new and ongoing problems.   Chest Pain (Nonspecific) It is often hard to give a specific diagnosis for the cause of chest pain. There is always a chance that your pain could be related to something serious, such as a heart attack or a blood clot in the lungs. You need to follow up with your health care provider for further evaluation. CAUSES   Heartburn.  Pneumonia or bronchitis.  Anxiety or stress.  Inflammation around your heart (pericarditis) or lung (pleuritis or pleurisy).  A blood clot in the lung.  A collapsed lung (pneumothorax). It can develop suddenly on its own (spontaneous pneumothorax) or from trauma to the chest.  Shingles infection (herpes zoster virus). The chest wall is composed of bones, muscles, and cartilage. Any of these can be the source of the pain.  The bones can be bruised by injury.  The muscles or cartilage can be strained by coughing or overwork.  The cartilage can be affected by inflammation and become sore (costochondritis). DIAGNOSIS  Lab tests or other studies may be needed to find the cause of your pain. Your health care provider may have you take a test called an ambulatory electrocardiogram (ECG). An ECG records your heartbeat patterns over a 24-hour period. You may also have other tests, such as:  Transthoracic  echocardiogram (TTE). During echocardiography, sound waves are used to evaluate how blood flows through your heart.  Transesophageal echocardiogram (TEE).  Cardiac monitoring. This allows your health care provider to monitor your heart rate and rhythm in real time.  Holter monitor. This is a portable device that records your heartbeat and can help diagnose heart arrhythmias. It allows your health care provider to track your heart activity for several days, if needed.  Stress tests by exercise or by giving medicine that makes the heart beat faster. TREATMENT   Treatment depends on what may be causing your chest pain. Treatment may include:  Acid blockers for heartburn.  Anti-inflammatory medicine.  Pain medicine for inflammatory conditions.  Antibiotics if an infection is present.  You may be advised to change lifestyle habits. This includes stopping smoking and avoiding alcohol, caffeine, and chocolate.  You may be advised to keep your head raised (elevated) when sleeping. This reduces the chance of acid going backward from your stomach into your esophagus. Most of the time, nonspecific chest pain will improve within 2-3 days with rest and mild pain medicine.  HOME CARE INSTRUCTIONS   If antibiotics were prescribed, take them as directed. Finish them even if you start to feel better.  For the next few days, avoid physical activities that bring on chest pain. Continue physical activities as directed.  Do not use any tobacco products, including cigarettes, chewing tobacco, or electronic cigarettes.  Avoid drinking alcohol.  Only take medicine as directed by your health care provider.  Follow your health  care provider's suggestions for further testing if your chest pain does not go away.  Keep any follow-up appointments you made. If you do not go to an appointment, you could develop lasting (chronic) problems with pain. If there is any problem keeping an appointment, call to  reschedule. SEEK MEDICAL CARE IF:   Your chest pain does not go away, even after treatment.  You have a rash with blisters on your chest.  You have a fever. SEEK IMMEDIATE MEDICAL CARE IF:   You have increased chest pain or pain that spreads to your arm, neck, jaw, back, or abdomen.  You have shortness of breath.  You have an increasing cough, or you cough up blood.  You have severe back or abdominal pain.  You feel nauseous or vomit.  You have severe weakness.  You faint.  You have chills. This is an emergency. Do not wait to see if the pain will go away. Get medical help at once. Call your local emergency services (911 in [place] Do not drive yourself to the hospital. MAKE SURE YOU:   Understand these instructions.  Will watch your condition.  Will get help right away if you are not doing well or get worse. Document Released: 03/01/2005 Document Revised: 05/27/2013 Document Reviewed: 12/26/2007 [facility] Patient Information 2015 [place], [place]. This information is not intended to replace advice given to you by your health care provider. Make sure you discuss any questions you have with your health care provider.

## 2015-11-10 ENCOUNTER — Other Ambulatory Visit: Payer: Self-pay | Admitting: Internal Medicine

## 2015-11-10 DIAGNOSIS — Z1231 Encounter for screening mammogram for malignant neoplasm of breast: Secondary | ICD-10-CM

## 2015-11-25 ENCOUNTER — Ambulatory Visit
Admission: RE | Admit: 2015-11-25 | Discharge: 2015-11-25 | Disposition: A | Discharge status: Home / Self Care | Payer: BC Managed Care – PPO | Source: Ambulatory Visit | Attending: Internal Medicine | Admitting: Internal Medicine

## 2015-11-25 DIAGNOSIS — Z1231 Encounter for screening mammogram for malignant neoplasm of breast: Secondary | ICD-10-CM

## 2015-12-02 ENCOUNTER — Encounter: Payer: Self-pay | Admitting: Podiatry

## 2015-12-02 ENCOUNTER — Ambulatory Visit (INDEPENDENT_AMBULATORY_CARE_PROVIDER_SITE_OTHER): Payer: BC Managed Care – PPO | Admitting: Podiatry

## 2015-12-02 VITALS — BP 128/76 | HR 88 | Resp 12

## 2015-12-02 DIAGNOSIS — L600 Ingrowing nail: Secondary | ICD-10-CM

## 2015-12-02 DIAGNOSIS — M21619 Bunion of unspecified foot: Secondary | ICD-10-CM

## 2015-12-02 NOTE — Progress Notes (Signed)
Subjective:     Patient ID: Suzanne Neal, female   DOB: 1945/08/20, 70 y.o.   MRN: [id number]  HPI patient presents with ingrown toenails of both big toes and states they're making increasingly hard for her to wear shoe gear comfortably and she's tried wider shoe gear and she's tried to trim them and get pedicures   Review of Systems  All other systems reviewed and are negative.      Objective:   Physical Exam  Constitutional: She is oriented to person, place, and time.  Cardiovascular: Intact distal pulses.   Musculoskeletal: Normal range of motion.  Neurological: She is oriented to person, place, and time.  Skin: Skin is warm.  Nursing note and vitals reviewed.  neurovascular status intact muscle strength adequate range of motion within normal limits with patient found to have incurvated hallux bilateral medial borders that are painful when pressed and making shoe gear difficult. 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 Explained procedure and risk and patient wants surgery and today I infiltrated each hallux 60 mg Xylocaine Marcaine mixture remove the medial borders exposed matrix and applied phenol 3 applications 30 seconds followed by alcohol lavage and sterile dressing. Gave instructions on soaks and reappoint

## 2015-12-02 NOTE — Progress Notes (Signed)
   Subjective:    Patient ID: Suzanne Neal, female    DOB: November 25, 1945, 70 y.o.   MRN: [id number]  HPI  Chief Complaint  Patient presents with  . Ingrown Toenail    ''B/L GREAT TOENAIL ARE SORE.''   PT STATED B/L GREAT TOENAIL BEEN SORE FOR ABOUT 10 YEARS. TOENAILS ARE GETTING WORSE ESPECIALLY WHEN PUTTING PRESSURE. TRIED TRIM-NO RELIEF.  Review of Systems  Skin: Positive for color change.       Objective:   Physical Exam        Assessment & Plan:

## 2016-02-16 ENCOUNTER — Encounter: Payer: Self-pay | Admitting: Podiatry

## 2016-02-16 ENCOUNTER — Ambulatory Visit (INDEPENDENT_AMBULATORY_CARE_PROVIDER_SITE_OTHER): Payer: BC Managed Care – PPO | Admitting: Podiatry

## 2016-02-16 VITALS — BP 138/78 | HR 76 | Resp 16

## 2016-02-16 DIAGNOSIS — M79605 Pain in left leg: Secondary | ICD-10-CM

## 2016-02-16 DIAGNOSIS — L03032 Cellulitis of left toe: Secondary | ICD-10-CM | POA: Diagnosis not present

## 2016-02-16 DIAGNOSIS — B351 Tinea unguium: Secondary | ICD-10-CM

## 2016-02-16 DIAGNOSIS — M79674 Pain in right toe(s): Secondary | ICD-10-CM | POA: Diagnosis not present

## 2016-02-16 DIAGNOSIS — M79675 Pain in left toe(s): Secondary | ICD-10-CM

## 2016-02-16 DIAGNOSIS — M79604 Pain in right leg: Secondary | ICD-10-CM

## 2016-02-16 DIAGNOSIS — L03012 Cellulitis of left finger: Secondary | ICD-10-CM

## 2016-02-17 ENCOUNTER — Encounter: Payer: BC Managed Care – PPO | Admitting: Podiatry

## 2016-02-17 NOTE — Progress Notes (Signed)
Subjective:     Patient ID: Suzanne Neal, female   DOB: 04/26/1946, 70 y.o.   MRN: [id number]  HPI patient states the left big toe has been swollen with localized drainage and minimal discomfort and it is better than it was but wanted to have it checked   Review of Systems     Objective:   Physical Exam Neurovascular status intact with left hallux medial border showing a small amount of localized drainage with no proximal edema erythema or drainage noted    Assessment:     Paronychia left hallux nail medial    Plan:     H&P conditions reviewed and today I went ahead and I explained soaks therapy and Neosporin usage and if symptoms were to get worse or any increased drainage or redness were to occur patient is to let us know immediately

## 2016-05-03 ENCOUNTER — Telehealth: Payer: Self-pay | Admitting: *Deleted

## 2016-05-03 NOTE — Telephone Encounter (Addendum)
Pt states she had an ingrown toenail procedure, and it heals then opens up with pus about every month. Pt states she got my message Monday to get an appt but she is having insurance problems and can not afford another bill. 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 I told pt it would benefit her to make an appt to be seen, but pt states she knows she is doing what she should and doesn't see why she should pay $600.00 for Dr. Charlsie Merlesegal to tell her that. Pt states she is soaking once daily and then allowing to air dry. I told pt to begin as if the procedure had just been performed and soak in epsom salt 1/2 C in 1 Qt water 2 times a day for and cover with the neosporin dressing for 4-6 weeks this would allow the area to remain open and heal from the inside out. Pt states she will do that and call Monday. I gave her my email to send the pictures.

## 2016-05-04 NOTE — Telephone Encounter (Signed)
That sounds reasonable and if still a problem she can come in without a charge for me to see

## 2016-06-26 ENCOUNTER — Encounter: Payer: Self-pay | Admitting: Podiatry

## 2016-06-26 ENCOUNTER — Ambulatory Visit (INDEPENDENT_AMBULATORY_CARE_PROVIDER_SITE_OTHER): Payer: Medicare PPO | Admitting: Podiatry

## 2016-06-26 DIAGNOSIS — L03032 Cellulitis of left toe: Secondary | ICD-10-CM | POA: Diagnosis not present

## 2016-06-26 NOTE — Patient Instructions (Signed)

## 2016-06-28 NOTE — Progress Notes (Signed)
Subjective:     Patient ID: Suzanne Neal, female   DOB: 1945/11/16, 71 y.o.   MRN: [id number]  HPI patient presents stating that the big toe on the left foot has started to get red again and irritated and she's been soaking it ever since we saw her last time she just did not come in because of insurance reasons   Review of Systems     Objective:   Physical Exam Neurovascular status intact negative Homans sign was noted with patient's left hallux medial side showing irritation with a possible abscess occurring within the proximal portion of the nail site. There is no proximal edema erythema or drainage noted at this time    Assessment:     Localized paronychia infection left hallux with possibility that this may be abscess formation    Plan:     H&P condition reviewed and at this time I went ahead and I infiltrated the left hallux 60 mg like Marcaine mixture and under sterile conditions I removed the medial portion of the small bit of scar tissue plus abscess tissue and I did find a small abscess present. It was cleaned out and I did not note any bone exposure or any other issue with out drainage to culture. I applied sterile dressing instructed on soaks and hopefully this will heal uneventfully and if it gives her any trouble she is to let us know immediately

## 2016-09-22 ENCOUNTER — Encounter: Payer: Self-pay | Admitting: Podiatry

## 2016-09-22 ENCOUNTER — Ambulatory Visit (INDEPENDENT_AMBULATORY_CARE_PROVIDER_SITE_OTHER): Payer: Medicare PPO | Admitting: Podiatry

## 2016-09-22 VITALS — BP 122/71 | HR 91 | Resp 16

## 2016-09-22 DIAGNOSIS — L600 Ingrowing nail: Secondary | ICD-10-CM | POA: Diagnosis not present

## 2016-09-22 DIAGNOSIS — L03032 Cellulitis of left toe: Secondary | ICD-10-CM

## 2016-09-22 NOTE — Patient Instructions (Signed)

## 2016-09-24 NOTE — Progress Notes (Signed)
Subjective:     Patient ID: Suzanne Neal, female   DOB: 04/07/1946, 71 y.o.   MRN: [id number]  HPI patient states she was doing fine with her left big toe for several months and that it started to drain again and she knows there is something in   Review of Systems     Objective:   Physical Exam Neurovascular status intact muscle strength adequate range of motion within normal limits with patient found to have a area of redness and irritation that's localized left first digit medial side that has no proximal edema erythema drainage noted associated with    Assessment:     Probable some nail growth deep in the left left hallux medial side that continues to regenerate    Plan:     H&P educated her and her friend on condition and today I went ahead and recommended nail removal on this medial side. I infiltrated 60 mg I can Marcaine mixture I then opened up the medial side and I found deep in tissue a nodule formation which appears to be nail that grew an abnormal pattern. This was removed and toe toe I did not note any bone exposure and I applied phenol to the base to try to kill any cells that may be present. Reappoint for [place] to recheck as needed and instructed on soaks

## 2016-09-27 ENCOUNTER — Ambulatory Visit: Payer: Medicare PPO | Admitting: Podiatry

## 2017-04-17 DIAGNOSIS — K589 Irritable bowel syndrome without diarrhea: Secondary | ICD-10-CM | POA: Insufficient documentation

## 2017-12-03 ENCOUNTER — Other Ambulatory Visit: Payer: Self-pay | Admitting: *Deleted

## 2017-12-03 DIAGNOSIS — R200 Anesthesia of skin: Secondary | ICD-10-CM

## 2017-12-21 DIAGNOSIS — E079 Disorder of thyroid, unspecified: Secondary | ICD-10-CM | POA: Insufficient documentation

## 2017-12-21 DIAGNOSIS — H9190 Unspecified hearing loss, unspecified ear: Secondary | ICD-10-CM | POA: Insufficient documentation

## 2018-01-29 ENCOUNTER — Encounter: Payer: BC Managed Care – PPO | Admitting: Neurology

## 2018-02-21 ENCOUNTER — Other Ambulatory Visit: Payer: Self-pay | Admitting: Otolaryngology

## 2018-02-21 DIAGNOSIS — E049 Nontoxic goiter, unspecified: Secondary | ICD-10-CM

## 2018-03-01 ENCOUNTER — Other Ambulatory Visit: Payer: Self-pay

## 2018-04-10 ENCOUNTER — Encounter: Payer: Self-pay | Admitting: Neurology

## 2018-04-11 ENCOUNTER — Other Ambulatory Visit: Payer: Self-pay | Admitting: *Deleted

## 2018-04-11 DIAGNOSIS — R200 Anesthesia of skin: Secondary | ICD-10-CM

## 2018-05-14 ENCOUNTER — Ambulatory Visit (INDEPENDENT_AMBULATORY_CARE_PROVIDER_SITE_OTHER): Payer: Medicare PPO | Admitting: Neurology

## 2018-05-14 DIAGNOSIS — G5622 Lesion of ulnar nerve, left upper limb: Secondary | ICD-10-CM

## 2018-05-14 DIAGNOSIS — R200 Anesthesia of skin: Secondary | ICD-10-CM

## 2018-05-14 NOTE — Procedures (Signed)
[facility]  [address], [address]  [place], [place] [address] Tel: [phone] Fax:  [phone] Test Date:  05/14/2018  Patient: Suzanne MangesLeona Neal DOB: 1946/04/07 Physician: Nita Sickleonika Patel, DO  Sex: Female Height: 5\' 6"  Ref Phys: Salvatore Marvelobert Wainer, MD  ID#: [id number] Temp: 36.0C Technician:    Patient Complaints: This is a 72 year-old female referred for evaluation of left arm radicular pain and paresthesias involving the 4th and 5th digits.  NCV & EMG Findings: Extensive electrodiagnostic testing of the left upper extremity shows:  1. Left median and ulnar sensory responses are within normal limits. 2. Left ulnar motor response shows decreased conduction velocity (A Elbow-B Elbow, 43 m/s).  Left median motor responses within normal limits.   3. Chronic motor axonal loss changes are seen affecting the biceps and deltoid muscles, without accompanied active denervation.    Impression: 1. Left ulnar neuropathy with slowing across the elbow, purely demyelinating in type, and mild in degree electrically. 2. 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   ___________________________ Nita Sickleonika Patel, DO    Nerve Conduction Studies Anti Sensory Summary Table   Stim Site NR Peak (ms) Norm Peak (ms) P-T Amp (V) Norm P-T Amp  Left Median Anti Sensory (2nd Digit)  36C  Wrist    3.3 <3.8 17.2 >10  Left Ulnar Anti Sensory (5th Digit)  36C  Wrist    2.6 <3.2 18.4 >5   Motor Summary Table   Stim Site NR Onset (ms) Norm Onset (ms) O-P Amp (mV) Norm O-P Amp Site1 Site2 Delta-0 (ms) Dist (cm) Vel (m/s) Norm Vel (m/s)  Left Median Motor (Abd Poll Brev)  36C  Wrist    3.0 <4.0 9.6 >5 Elbow Wrist 4.7 27.0 57 >50  Elbow    7.7  8.5         Left Ulnar Motor (Abd Dig Minimi)  36C  Wrist    2.0 <3.1 7.1 >7 B Elbow Wrist 3.5 23.0 66 >50  B Elbow    5.5  7.1  A Elbow B Elbow 2.3 10.0 43 >50  A Elbow    7.8  6.5          EMG   Side Muscle  Ins Act Fibs Psw Fasc Number Recrt Dur Dur. Amp Amp. Poly Poly. Comment  Left 1stDorInt Nml Nml Nml Nml Nml Nml Nml Nml Nml Nml Nml Nml N/A  Left Ext Indicis Nml Nml Nml Nml Nml Nml Nml Nml Nml Nml Nml Nml N/A  Left PronatorTeres Nml Nml Nml Nml Nml Nml Nml Nml Nml Nml Nml Nml N/A  Left Biceps Nml Nml Nml Nml 1- Rapid Few 1+ Few 1+ Nml Nml N/A  Left Deltoid Nml Nml Nml Nml 1- Rapid Few 1+ Few 1+ Nml Nml N/A  Left Triceps Nml Nml Nml Nml Nml Nml Nml Nml Nml Nml Nml Nml N/A  Left ABD Dig Min Nml Nml Nml Nml Nml Nml Nml Nml Nml Nml Nml Nml N/A  Left FlexCarpiUln Nml Nml Nml Nml Nml Nml Nml Nml Nml Nml Nml Nml N/A      Waveforms:

## 2018-06-07 ENCOUNTER — Other Ambulatory Visit: Payer: Self-pay | Admitting: Family Medicine

## 2018-06-07 DIAGNOSIS — Z1231 Encounter for screening mammogram for malignant neoplasm of breast: Secondary | ICD-10-CM

## 2018-06-11 DIAGNOSIS — G5622 Lesion of ulnar nerve, left upper limb: Secondary | ICD-10-CM | POA: Insufficient documentation

## 2018-06-12 ENCOUNTER — Ambulatory Visit
Admission: RE | Admit: 2018-06-12 | Discharge: 2018-06-12 | Disposition: A | Discharge status: Home / Self Care | Payer: Medicare HMO | Source: Ambulatory Visit | Attending: Family Medicine | Admitting: Family Medicine

## 2018-06-12 DIAGNOSIS — Z1231 Encounter for screening mammogram for malignant neoplasm of breast: Secondary | ICD-10-CM

## 2018-06-17 ENCOUNTER — Other Ambulatory Visit: Payer: Self-pay | Admitting: Family Medicine

## 2018-06-17 DIAGNOSIS — E049 Nontoxic goiter, unspecified: Secondary | ICD-10-CM

## 2018-06-20 ENCOUNTER — Ambulatory Visit
Admission: RE | Admit: 2018-06-20 | Discharge: 2018-06-20 | Disposition: A | Discharge status: Home / Self Care | Payer: Medicare HMO | Source: Ambulatory Visit | Attending: Family Medicine | Admitting: Family Medicine

## 2018-06-20 DIAGNOSIS — E049 Nontoxic goiter, unspecified: Secondary | ICD-10-CM

## 2018-07-18 DIAGNOSIS — E042 Nontoxic multinodular goiter: Secondary | ICD-10-CM | POA: Insufficient documentation

## 2018-08-06 DIAGNOSIS — J309 Allergic rhinitis, unspecified: Secondary | ICD-10-CM | POA: Insufficient documentation

## 2018-08-06 DIAGNOSIS — N3281 Overactive bladder: Secondary | ICD-10-CM | POA: Insufficient documentation

## 2018-08-06 DIAGNOSIS — J45909 Unspecified asthma, uncomplicated: Secondary | ICD-10-CM | POA: Insufficient documentation

## 2018-08-06 DIAGNOSIS — E785 Hyperlipidemia, unspecified: Secondary | ICD-10-CM | POA: Insufficient documentation

## 2019-04-23 ENCOUNTER — Other Ambulatory Visit: Payer: Self-pay | Admitting: Otolaryngology

## 2019-04-23 DIAGNOSIS — E049 Nontoxic goiter, unspecified: Secondary | ICD-10-CM

## 2019-07-09 ENCOUNTER — Other Ambulatory Visit: Payer: Self-pay | Admitting: Family Medicine

## 2019-07-09 DIAGNOSIS — D696 Thrombocytopenia, unspecified: Secondary | ICD-10-CM

## 2019-07-09 DIAGNOSIS — D72819 Decreased white blood cell count, unspecified: Secondary | ICD-10-CM

## 2019-07-09 LAB — TSH: TSH: 0.87 (ref <=5.90)

## 2019-07-17 ENCOUNTER — Other Ambulatory Visit: Payer: Medicare HMO

## 2019-07-24 ENCOUNTER — Other Ambulatory Visit: Payer: Medicare HMO

## 2019-07-30 ENCOUNTER — Ambulatory Visit
Admission: RE | Admit: 2019-07-30 | Discharge: 2019-07-30 | Disposition: A | Discharge status: Home / Self Care | Payer: Medicare HMO | Source: Ambulatory Visit | Attending: Family Medicine | Admitting: Family Medicine

## 2019-07-30 DIAGNOSIS — D696 Thrombocytopenia, unspecified: Secondary | ICD-10-CM

## 2019-07-30 DIAGNOSIS — D72819 Decreased white blood cell count, unspecified: Secondary | ICD-10-CM

## 2019-08-04 NOTE — Progress Notes (Signed)
Patient ID: Suzanne Neal, female   DOB: [birth year], 74 y.o.   MRN: [id number]             Reason for Appointment: Evaluation of thyroid nodule    History of Present Illness:   The patient is being referred by Dr.Okwubunka-Anyim  The patient's thyroid nodule was first discovered in late 2019 At that time she was having a home visit from a nurse She did not report any local pressure symptoms at that time She was sent for an ultrasound with the following results    The thyroid ultrasound in 07/2018 showed the following Right thyroid nodule- solid, heterogeneous 5.4x2.7x4.1cm smooth Borders, this was reported as 5.8 cm at another location in 06/2018. The nodule was isoechoic and almost completely solid  Needle aspiration biopsy was done on the right thyroid with II (smears): Results showedbenign (Bethesda category 2). Consistent with a benign follicular nodule.  Specimen Adequacy: Satisfactory for evaluation.    Scant cellularity.  She was recommended partial thyroidectomy but she was not wanting to do this Since then she has not had any follow-up until now She is asking about needing iodine supplements for the thyroid nodule  Currently does not complain of any local pressure symptoms, discomfort, hoarseness or difficulty swallowing She is now referred here for further evaluation  She thinks her thyroid levels were checked by PCP and TSH was normal, report not available  No results found for: FREET4, TSH  Allergies as of 08/05/2019   No Known Allergies     Medication List       Accurate as of August 05, 2019 11:32 AM. If you have any questions, ask your nurse or doctor.        STOP taking these medications   BEE POLLEN PO Stopped by: Reather Littler, MD   escitalopram 10 MG tablet Commonly known as: LEXAPRO Stopped by: Reather Littler, MD     TAKE these medications   albuterol (2.5 MG/3ML) 0.083% nebulizer solution Commonly known as: PROVENTIL Take 2.5 mg by  nebulization as needed for shortness of breath.   albuterol 108 (90 Base) MCG/ACT inhaler Commonly known as: VENTOLIN HFA Inhale 1 puff into the lungs as needed.   chlorhexidine 0.12 % solution Commonly known as: PERIDEX Use as directed 15 mLs in the mouth or throat as needed (for canker sore).   fluticasone 50 MCG/ACT nasal spray Commonly known as: FLONASE Place 1 spray into both nostrils as needed for allergies or rhinitis.   Fluticasone-Salmeterol 100-50 MCG/DOSE Aepb Commonly known as: ADVAIR Inhale 1 puff into the lungs as needed (for shortness of breath).   ibuprofen 800 MG tablet Commonly known as: ADVIL Take 800 mg by mouth as directed. Take  2 - 3  Times per week.   Linzess 290 MCG Caps capsule Generic drug: linaclotide Take 290 mcg by mouth daily before breakfast. What changed: Another medication with the same name was removed. Continue taking this medication, and follow the directions you see here. Changed by: Reather Littler, MD   MSM PO Take 1 tablet by mouth as needed (for joint health).   nitroGLYCERIN 0.2 mg/hr patch Commonly known as: NITRODUR - Dosed in mg/24 hr Place 1 patch (0.2 mg total) onto the skin daily.   omeprazole 20 MG capsule Commonly known as: PRILOSEC Take 1 capsule (20 mg total) by mouth daily.   OVER THE COUNTER MEDICATION Take 1 tablet by mouth as needed Jacqualine Code" for sleep).       Allergies: No  Known Allergies  Past Medical History:  Diagnosis Date  . Arthritis   . Leukopenia   . Thyroid disease     There is no history of radiation to the neck in childhood  Past Surgical History:  Procedure Laterality Date  . CARPAL TUNNEL RELEASE  2007   Bilaterally  . KNEE ARTHROSCOPY  2009   Left knee    Family History  Problem Relation Age of Onset  . Diabetes Mother   . Dementia Mother   . Sarcoidosis Maternal Grandmother   . Rheum arthritis Maternal Grandmother   . Breast cancer Neg Hx   . Thyroid disease Neg Hx      Social History:  reports that she quit smoking about 35 years ago. Her smoking use included cigarettes. She quit after 29.00 years of use. She has never used smokeless tobacco. She reports that she does not drink alcohol or use drugs.   Review of Systems  Constitutional: Negative for weight loss and weight gain.  Respiratory: Positive for daytime sleepiness.        She gets sleepy sometimes especially in the afternoon and will take an herbal supplement containing green tea with improvement  Cardiovascular: Negative for leg swelling.  Gastrointestinal: Positive for constipation.  Endocrine: Negative for fatigue and cold intolerance.  Musculoskeletal: Positive for joint pain.  Skin: Negative for dry skin.  Neurological: Negative for numbness.  Psychiatric/Behavioral: Negative for insomnia.   Her highest A1c has been 6.5 and recently from PCP was 5.2   Examination:   BP 128/74 (BP Location: Left Arm, Patient Position: Sitting, Cuff Size: Normal)   Pulse 62   Ht 5\' 6"  (1.676 m)   Wt 210 lb (95.3 kg)   SpO2 98%   BMI 33.89 kg/m    General Appearance:  well-looking        Eyes: No abnormal prominence or swelling of the eyes          THYROID: Thyroid nodule is palpable on the right side extending to the isthmus. This measures about 5.5 cm, is smooth, fleshy texture Left lobe not palpable Trachea not deviated No stridor  There is no lymphadenopathy in the neck   Heart sounds normal Lungs clear Abdomen shows no hepatosplenomegaly or other mass.    Reflexes at biceps are normal.  Skin: No rash or lesions Extremities: No edema  Assessment/Plan:  Thyroid nodule: She has a large right-sided thyroid nodule, measuring about 5.5 cm diagnosed about 1-1/2 years ago Ultrasound showed TI-RADS score of 3 Needle aspiration showed benign cytology although reportedly had scant cellularity but no mention of colloid She has been reluctant to consider surgery  Currently does not have  any local pressure symptoms or dysphagia  Since it has been a year since her last ultrasound and biopsy can reevaluate her with a follow-up ultrasound now We will request reports of thyroid function from PCP Discussed etiology of thyroid nodules, lack of evidence of any causative factors, no need for iron supplementation and potentially need for repeat biopsy if there is a significant change in the size or characteristics of the nodule or  History of daytime somnolence: Etiology unclear and reportedly TSH was normal recently  Consultation note sent to the referring physician  Follow-up in 1 year unless any intervention is needed  Elayne Snare 08/05/2019

## 2019-08-05 ENCOUNTER — Ambulatory Visit (INDEPENDENT_AMBULATORY_CARE_PROVIDER_SITE_OTHER): Payer: Medicare HMO | Admitting: Endocrinology

## 2019-08-05 ENCOUNTER — Encounter: Payer: Self-pay | Admitting: Endocrinology

## 2019-08-05 ENCOUNTER — Other Ambulatory Visit: Payer: Self-pay

## 2019-08-05 VITALS — BP 128/74 | HR 62 | Ht 66.0 in | Wt 210.0 lb

## 2019-08-05 DIAGNOSIS — E041 Nontoxic single thyroid nodule: Secondary | ICD-10-CM | POA: Diagnosis not present

## 2019-08-05 DIAGNOSIS — R400 Somnolence: Secondary | ICD-10-CM

## 2019-08-07 ENCOUNTER — Telehealth: Payer: Self-pay | Admitting: Endocrinology

## 2019-08-07 ENCOUNTER — Other Ambulatory Visit: Payer: Self-pay

## 2019-08-07 NOTE — Telephone Encounter (Signed)
Patient called and asked that the following medications be removed from her medication list - she states that she is not taking them and does not want them to appear on her medication list  Chlorhexidine Fluticasone-Salmeterol NitroGLYCERIN Omeprazole

## 2019-08-07 NOTE — Telephone Encounter (Signed)
Medications have been reconciled

## 2019-08-14 ENCOUNTER — Ambulatory Visit
Admission: RE | Admit: 2019-08-14 | Discharge: 2019-08-14 | Disposition: A | Discharge status: Home / Self Care | Payer: Medicare HMO | Source: Ambulatory Visit | Attending: Endocrinology | Admitting: Endocrinology

## 2019-08-14 DIAGNOSIS — E041 Nontoxic single thyroid nodule: Secondary | ICD-10-CM

## 2020-01-22 IMAGING — MG DIGITAL SCREENING BILATERAL MAMMOGRAM WITH TOMO AND CAD
8 series · 8 of 24 positions shown · non-contrast
Comparison: Previous exam(s).

CLINICAL DATA: Screening.

EXAM:
DIGITAL SCREENING BILATERAL MAMMOGRAM WITH TOMO AND CAD

[R MLO synth-2D]
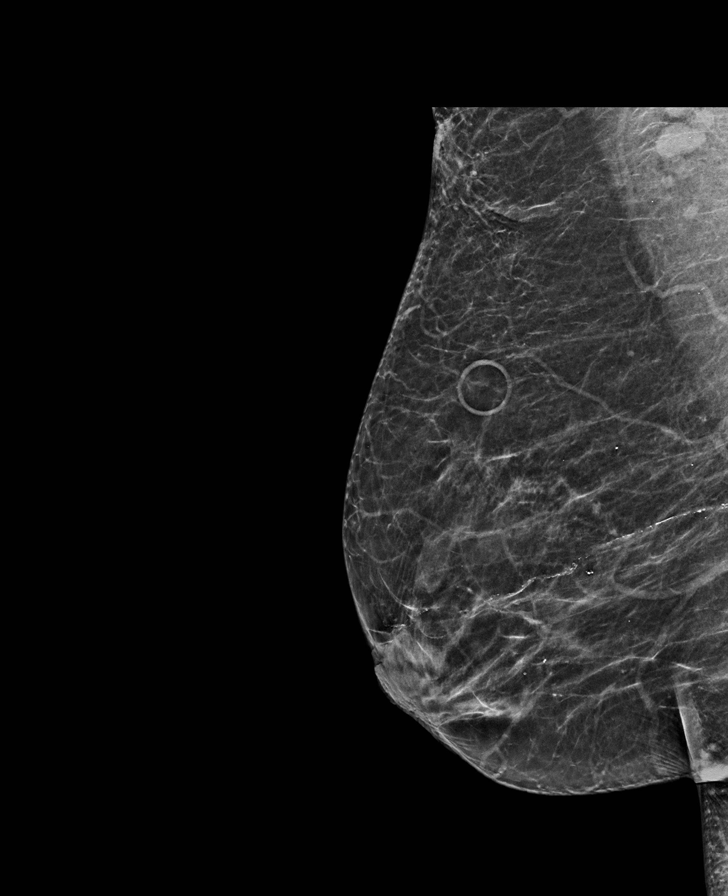

[L CC synth-2D]
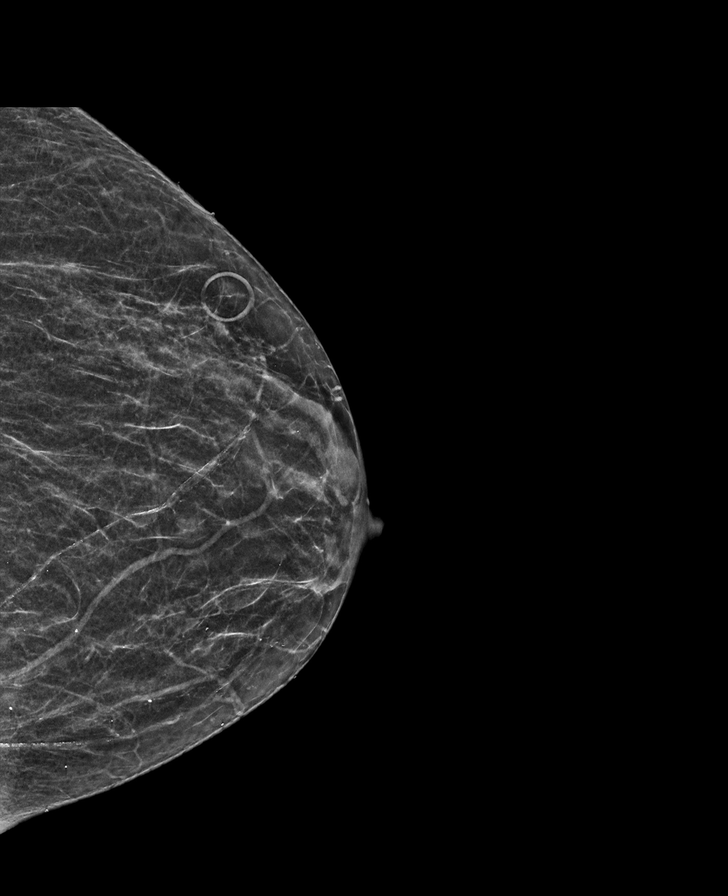

[R CC synth-2D]
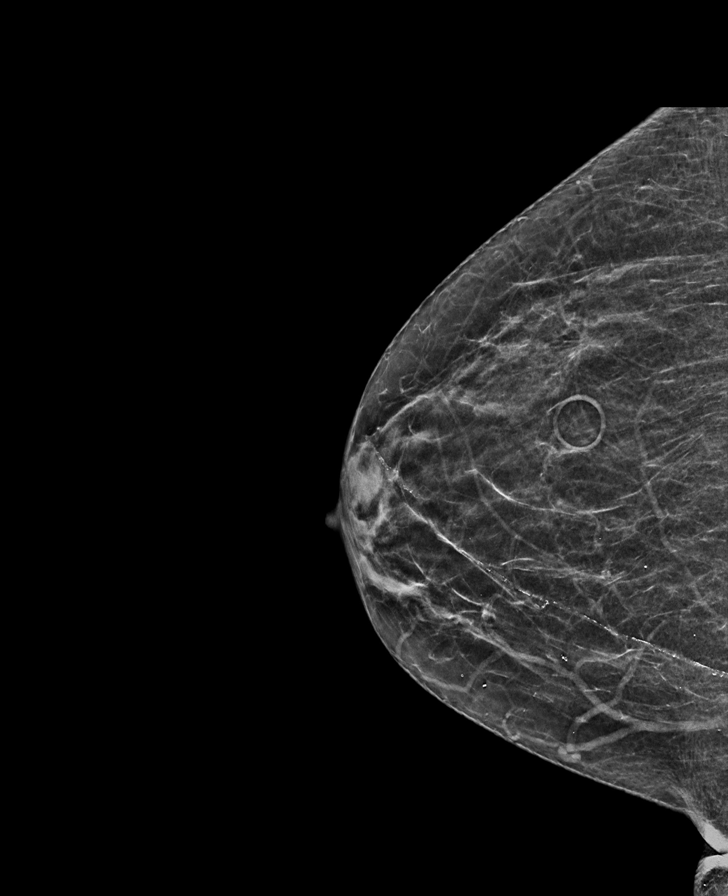

[L MLO synth-2D]
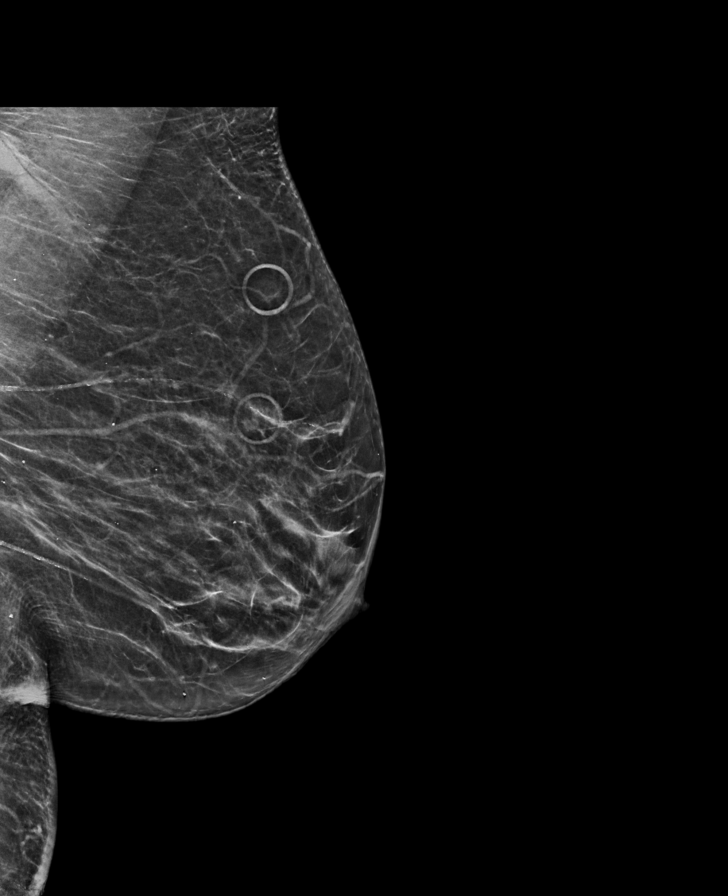

[R CC tomo · tomo slice 27/54.0]
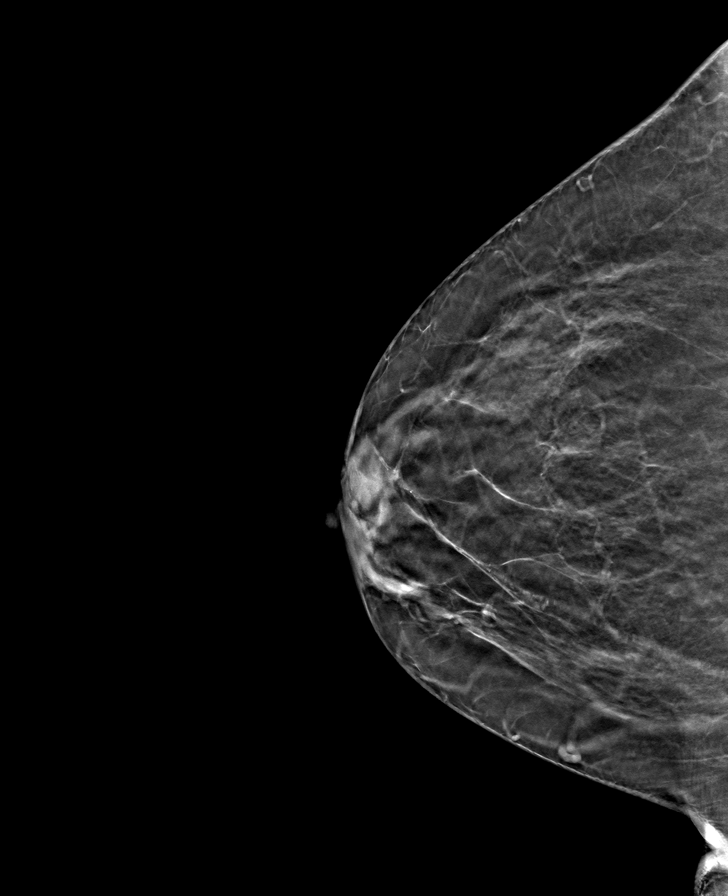

[L MLO tomo · tomo slice 31/61.0]
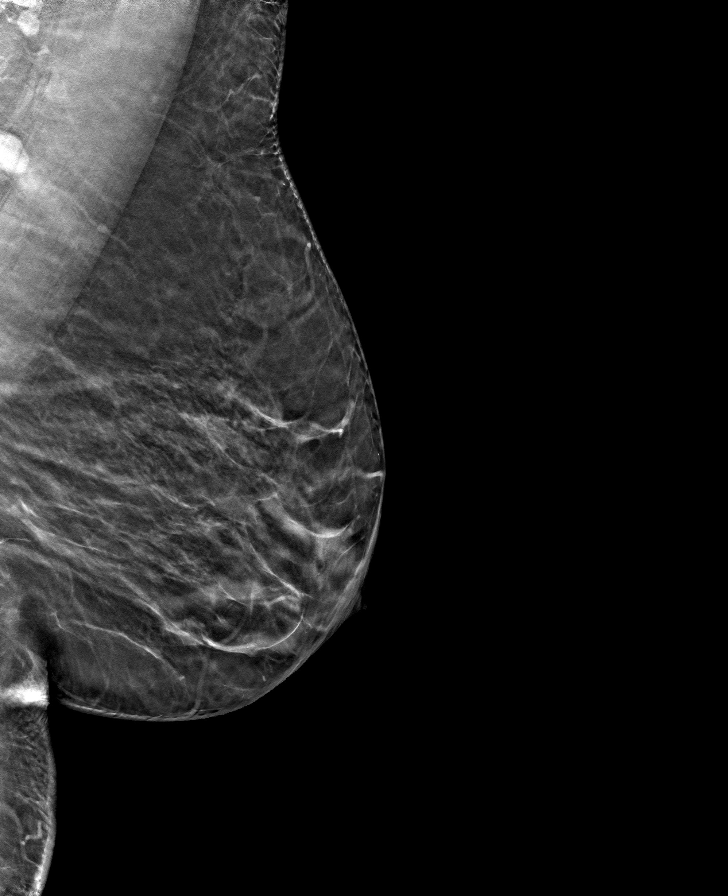

[L CC tomo · tomo slice 25/50.0]
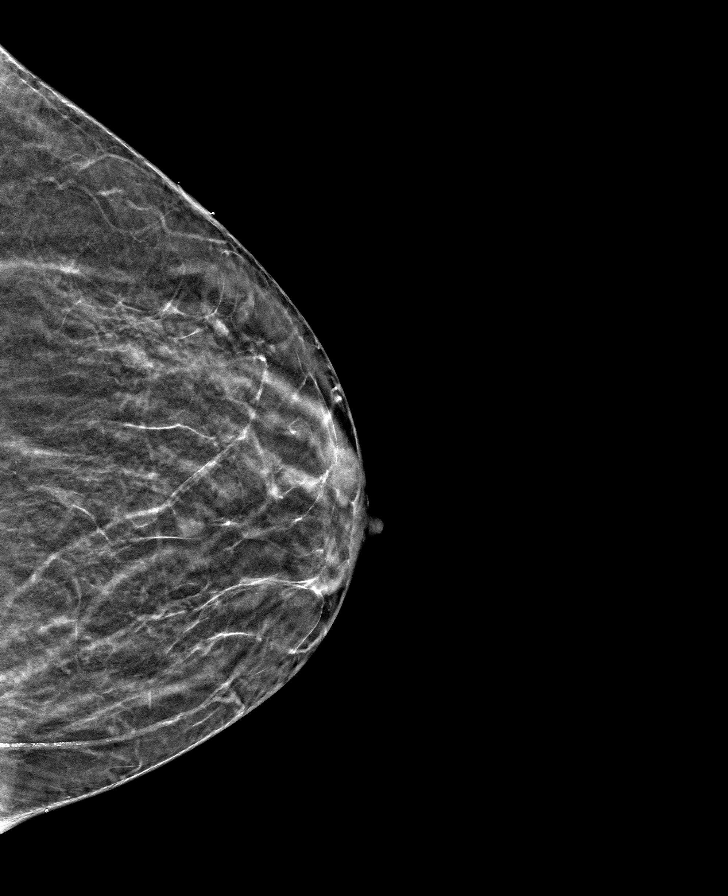

[R MLO tomo · tomo slice 32/63.0]
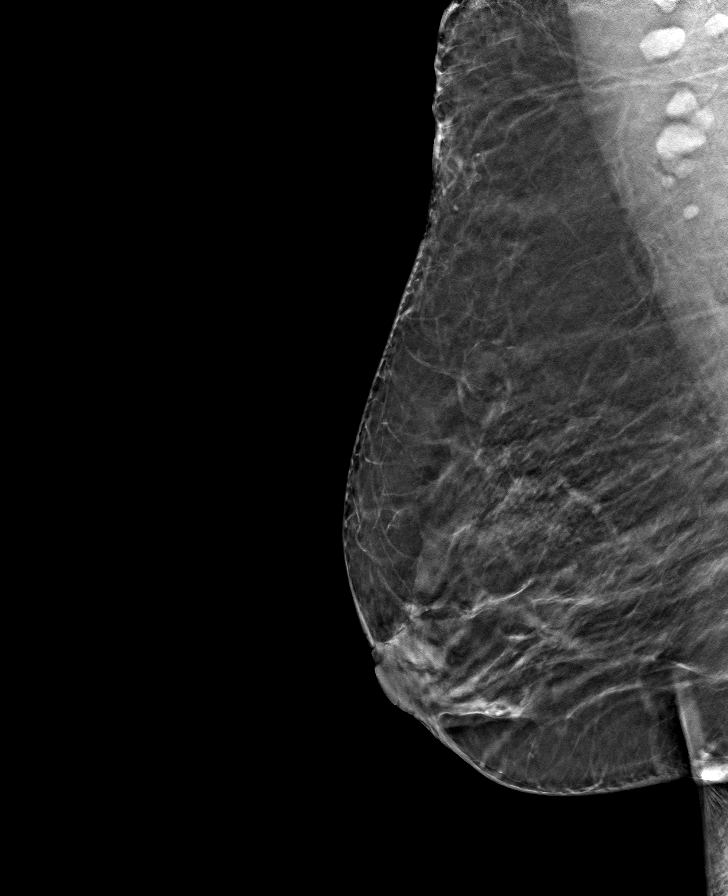

[8 of 24 positions shown; findings below may reference images not displayed]

ACR Breast Density Category b: There are scattered areas of
fibroglandular density.
FINDINGS: There are no findings suspicious for malignancy. Images were
processed with CAD.
IMPRESSION: No mammographic evidence of malignancy. A result letter of this
screening mammogram will be mailed directly to the patient.

RECOMMENDATION:
Screening mammogram in one year. (Code:[id number]-775)

BI-RADS CATEGORY  1: Negative.

## 2020-05-31 ENCOUNTER — Ambulatory Visit: Payer: Medicare PPO | Admitting: Podiatry

## 2020-07-02 ENCOUNTER — Other Ambulatory Visit: Payer: Medicare HMO

## 2020-07-06 ENCOUNTER — Ambulatory Visit: Payer: Medicare HMO | Admitting: Endocrinology

## 2020-07-06 NOTE — Progress Notes (Deleted)
Patient ID: Suzanne Neal, female   DOB: [birth year], 75 y.o.   MRN: [id number]             Reason for Appointment: Evaluation of thyroid nodule    History of Present Illness:   The patient is being referred by Dr.Okwubunka-Anyim  The patient's thyroid nodule was first discovered in late 2019 At that time she was having a home visit from a nurse She did not report any local pressure symptoms at that time She was sent for an ultrasound with the following results    The thyroid ultrasound in 07/2018 showed the following Right thyroid nodule- solid, heterogeneous 5.4x2.7x4.1cm smooth Borders, this was reported as 5.8 cm at another location in 06/2018. The nodule was isoechoic and almost completely solid  Needle aspiration biopsy was done on the right thyroid with II (smears): Results showedbenign (Bethesda category 2). Consistent with a benign follicular nodule.  Specimen Adequacy: Satisfactory for evaluation.    Scant cellularity.  She was recommended partial thyroidectomy but she was not wanting to do this Since then she has not had any follow-up until now She is asking about needing iodine supplements for the thyroid nodule  Currently does not complain of any local pressure symptoms, discomfort, hoarseness or difficulty swallowing She is now referred here for further evaluation    Lab Results  Component Value Date   TSH 0.87 07/09/2019    Allergies as of 07/06/2020   No Known Allergies     Medication List       Accurate as of July 06, 2020  8:12 AM. If you have any questions, ask your nurse or doctor.        albuterol (2.5 MG/3ML) 0.083% nebulizer solution Commonly known as: PROVENTIL Take 2.5 mg by nebulization as needed for shortness of breath.   albuterol 108 (90 Base) MCG/ACT inhaler Commonly known as: VENTOLIN HFA Inhale 1 puff into the lungs as needed.   fluticasone 50 MCG/ACT nasal spray Commonly known as: FLONASE Place 1 spray into both  nostrils as needed for allergies or rhinitis.   ibuprofen 800 MG tablet Commonly known as: ADVIL Take 800 mg by mouth as directed. Take  2 - 3  Times per week.   Linzess 290 MCG Caps capsule Generic drug: linaclotide Take 290 mcg by mouth daily before breakfast.   MSM PO Take 1 tablet by mouth as needed (for joint health).   OVER THE COUNTER MEDICATION Take 1 tablet by mouth as needed Jacqualine Code" for sleep).       Allergies: No Known Allergies  Past Medical History:  Diagnosis Date  . Arthritis   . Leukopenia   . Thyroid disease     There is no history of radiation to the neck in childhood  Past Surgical History:  Procedure Laterality Date  . CARPAL TUNNEL RELEASE  2007   Bilaterally  . KNEE ARTHROSCOPY  2009   Left knee    Family History  Problem Relation Age of Onset  . Diabetes Mother   . Dementia Mother   . Arthritis Mother   . Sarcoidosis Maternal Grandmother   . Rheum arthritis Maternal Grandmother   . Arthritis Maternal Grandmother   . Breast cancer Neg Hx   . Thyroid disease Neg Hx     Social History:  reports that she quit smoking about 36 years ago. Her smoking use included cigarettes. She quit after 29.00 years of use. She has never used smokeless tobacco. She reports that she does not  drink alcohol and does not use drugs.   Review of Systems  Constitutional: Negative for weight loss and weight gain.  Respiratory: Positive for daytime sleepiness.        She gets sleepy sometimes especially in the afternoon and will take an herbal supplement containing green tea with improvement  Cardiovascular: Negative for leg swelling.  Gastrointestinal: Positive for constipation.  Endocrine: Negative for fatigue and cold intolerance.  Musculoskeletal: Positive for joint pain.  Skin: Negative for dry skin.  Neurological: Negative for numbness.  Psychiatric/Behavioral: Negative for insomnia.   Her highest A1c has been 6.5 and recently from PCP was 5.2    Examination:   There were no vitals taken for this visit.   General Appearance:  well-looking        Eyes: No abnormal prominence or swelling of the eyes          THYROID: Thyroid nodule is palpable on the right side extending to the isthmus. This measures about 5.5 cm, is smooth, fleshy texture Left lobe not palpable Trachea not deviated No stridor  There is no lymphadenopathy in the neck   Heart sounds normal Lungs clear Abdomen shows no hepatosplenomegaly or other mass.    Reflexes at biceps are normal.  Skin: No rash or lesions Extremities: No edema  Assessment/Plan:  Thyroid nodule: She has a large right-sided thyroid nodule, measuring about 5.5 cm diagnosed about 1-1/2 years ago Ultrasound showed TI-RADS score of 3 Needle aspiration showed benign cytology although reportedly had scant cellularity but no mention of colloid She has been reluctant to consider surgery  Currently does not have any local pressure symptoms or dysphagia  Since it has been a year since her last ultrasound and biopsy can reevaluate her with a follow-up ultrasound now We will request reports of thyroid function from PCP Discussed etiology of thyroid nodules, lack of evidence of any causative factors, no need for iron supplementation and potentially need for repeat biopsy if there is a significant change in the size or characteristics of the nodule or  History of daytime somnolence: Etiology unclear and reportedly TSH was normal recently    Follow-up in 1 year unless any intervention is needed  Reather Littler 07/06/2020

## 2020-07-07 ENCOUNTER — Telehealth: Payer: Self-pay | Admitting: Endocrinology

## 2020-07-07 NOTE — Telephone Encounter (Signed)
Pt called to schedule her follow up and was wondering if she could get her labs done prior to check her insulin levels? She also wants to know if she would have to fast for that lab because that would depend on what time she books the lab appt.

## 2020-07-07 NOTE — Telephone Encounter (Signed)
Please see below and advise.

## 2020-07-08 NOTE — Telephone Encounter (Signed)
I am only seeing her for the thyroid nodule.  She is supposed to be seeing her PCP for diabetes screening and do not have any records.  Currently she is not scheduled for labs also

## 2020-07-13 ENCOUNTER — Other Ambulatory Visit (INDEPENDENT_AMBULATORY_CARE_PROVIDER_SITE_OTHER): Payer: Medicare HMO

## 2020-07-13 ENCOUNTER — Other Ambulatory Visit: Payer: Self-pay | Admitting: Endocrinology

## 2020-07-13 ENCOUNTER — Telehealth: Payer: Self-pay | Admitting: *Deleted

## 2020-07-13 ENCOUNTER — Other Ambulatory Visit: Payer: Self-pay

## 2020-07-13 DIAGNOSIS — R7301 Impaired fasting glucose: Secondary | ICD-10-CM

## 2020-07-13 DIAGNOSIS — E041 Nontoxic single thyroid nodule: Secondary | ICD-10-CM

## 2020-07-13 LAB — T4, FREE: Free T4: 0.73 ng/dL (ref 0.60–1.60)

## 2020-07-13 LAB — HEMOGLOBIN A1C: Hgb A1c MFr Bld: 6 % (ref 4.6–6.5)

## 2020-07-13 LAB — GLUCOSE, RANDOM: Glucose, Bld: 85 mg/dL (ref 70–99)

## 2020-07-13 LAB — TSH: TSH: 1.45 u[IU]/mL (ref 0.35–4.50)

## 2020-07-13 NOTE — Telephone Encounter (Signed)
Noted, we don't do insulin levels

## 2020-07-15 ENCOUNTER — Ambulatory Visit: Payer: Medicare HMO | Admitting: Endocrinology

## 2020-07-16 ENCOUNTER — Ambulatory Visit: Payer: Medicare HMO | Admitting: Endocrinology

## 2020-07-16 ENCOUNTER — Other Ambulatory Visit: Payer: Self-pay

## 2020-07-16 ENCOUNTER — Encounter: Payer: Self-pay | Admitting: Endocrinology

## 2020-07-16 VITALS — BP 140/76 | HR 76 | Ht 66.0 in | Wt 202.0 lb

## 2020-07-16 DIAGNOSIS — E041 Nontoxic single thyroid nodule: Secondary | ICD-10-CM

## 2020-07-16 DIAGNOSIS — R7303 Prediabetes: Secondary | ICD-10-CM

## 2020-07-16 NOTE — Progress Notes (Signed)
Patient ID: Suzanne Neal, female   DOB: August 28, 1945, 75 y.o.   MRN: [id number]             Reason for Appointment: Evaluation of thyroid nodule    History of Present Illness:   The patient is being referred by Dr.Okwubunka-Anyim  The patient's thyroid nodule was first discovered in late 2019 At that time she was having a home visit from a nurse and had not noticed the swelling herself  The thyroid ultrasound in 07/2018 showed the following Right thyroid nodule- solid, heterogeneous 5.4x2.7x4.1cm smooth Borders, this was reported as 5.8 cm at another location in 06/2018. The nodule was isoechoic and almost completely solid  Needle aspiration biopsy was done on the right thyroid with II (smears): Results showedbenign (Bethesda category 2). Consistent with a benign follicular nodule. Specimen Adequacy: Satisfactory for evaluation. Scant cellularity.  She is coming back for annual follow-up On her initial consultation in 3/21 she was sent for follow-up ultrasound which showed similar results as above with the nodule 6.1 cm She was recommended observation only Again she does not complain of any local pressure symptoms, discomfort, hoarseness or difficulty swallowing  Thyroid functions have been consistently normal She is asking about whether she has Hashimoto's thyroiditis  Lab Results  Component Value Date   FREET4 0.73 07/13/2020   TSH 1.45 07/13/2020   TSH 0.87 07/09/2019    Allergies as of 07/16/2020   No Known Allergies     Medication List       Accurate as of July 16, 2020 10:21 AM. If you have any questions, ask your nurse or doctor.        albuterol (2.5 MG/3ML) 0.083% nebulizer solution Commonly known as: PROVENTIL Take 2.5 mg by nebulization as needed for shortness of breath.   albuterol 108 (90 Base) MCG/ACT inhaler Commonly known as: VENTOLIN HFA Inhale 1 puff into the lungs as needed.   fluticasone 50 MCG/ACT nasal spray Commonly known as:  FLONASE Place 1 spray into both nostrils as needed for allergies or rhinitis.   ibuprofen 800 MG tablet Commonly known as: ADVIL Take 800 mg by mouth as directed. Take  2 - 3  Times per week.   linaclotide 290 MCG Caps capsule Commonly known as: LINZESS Take 290 mcg by mouth daily before breakfast.   MSM PO Take 1 tablet by mouth as needed (for joint health).   OVER THE COUNTER MEDICATION Take 1 tablet by mouth as needed Jacqualine Code" for sleep).       Allergies: No Known Allergies  Past Medical History:  Diagnosis Date  . Arthritis   . Leukopenia   . Thyroid disease     There is no history of radiation to the neck in childhood  Past Surgical History:  Procedure Laterality Date  . CARPAL TUNNEL RELEASE  2007   Bilaterally  . KNEE ARTHROSCOPY  2009   Left knee    Family History  Problem Relation Age of Onset  . Diabetes Mother   . Dementia Mother   . Arthritis Mother   . Sarcoidosis Maternal Grandmother   . Rheum arthritis Maternal Grandmother   . Arthritis Maternal Grandmother   . Breast cancer Neg Hx   . Thyroid disease Neg Hx     Social History:  reports that she quit smoking about 36 years ago. Her smoking use included cigarettes. She quit after 29.00 years of use. She has never used smokeless tobacco. She reports that she does not drink alcohol and  does not use drugs.   Review of Systems  Prediabetes: Her highest A1c has been 6.5 and lowest 5.2 Previously followed by PCP She has a strong family history of diabetes and is asking about whether she has metabolic syndrome and wants an insulin level  She is generally trying to moderate her diet but does not have a formal meal plan  Nonfasting glucose midday was 85 and recent blood sugars have been normal  Lab Results  Component Value Date   HGBA1C 6.0 07/13/2020   Lab Results  Component Value Date   CREATININE 0.75 06/24/2014      Examination:   BP 140/76   Pulse 76   Ht 5\' 6"  (1.676 m)    Wt 202 lb (91.6 kg)   SpO2 99%   BMI 32.60 kg/m            THYROID nodule is palpable on the right side extending to the isthmus. This measures about 6 cm, is smooth, fleshy and slightly firm  Left lobe not palpable Trachea not deviated No stridor  There is no lymphadenopathy in the neck   Biceps reflexes appear normal  Assessment/Plan:  Thyroid nodule: She has a large right-sided thyroid nodule, measuring about 6 cm  Ultrasound showed TI-RADS score of 3 This has been benign on needle aspiration which showed benign cytology  Her exam is showing about the same size of the nodule with minimal increase No local pressure symptoms and no deviation of the trachea  Discussed that her right-sided nodule is benign as per previous biopsy and ultrasound characteristics show low-grade TI-RADS score The nodule is relatively soft and smooth  Prediabetes: She is concerned about her family history and likelihood of getting diabetes Currently no evidence of hyperglycemia and not clear if her A1c truly represents prediabetes She will have a glucose tolerance test to further evaluate this   07/16/2020

## 2020-08-13 ENCOUNTER — Other Ambulatory Visit: Payer: Medicare HMO

## 2020-08-16 ENCOUNTER — Telehealth: Payer: Self-pay | Admitting: *Deleted

## 2020-08-16 NOTE — Telephone Encounter (Signed)
Patient is wanting to be seen for possible Neuropathy, has never been diagnosed, problems with left 4th toe pain and right heel pain. Her last appointment scheduled was a no show. Please call.

## 2020-08-17 NOTE — Telephone Encounter (Signed)
Pt is scheduled °

## 2020-08-26 ENCOUNTER — Other Ambulatory Visit: Payer: Self-pay

## 2020-08-26 ENCOUNTER — Ambulatory Visit: Payer: Medicare HMO | Admitting: Podiatry

## 2020-08-26 ENCOUNTER — Ambulatory Visit (INDEPENDENT_AMBULATORY_CARE_PROVIDER_SITE_OTHER): Payer: Medicare HMO

## 2020-08-26 ENCOUNTER — Encounter: Payer: Self-pay | Admitting: Podiatry

## 2020-08-26 DIAGNOSIS — M79672 Pain in left foot: Secondary | ICD-10-CM

## 2020-08-26 DIAGNOSIS — M722 Plantar fascial fibromatosis: Secondary | ICD-10-CM

## 2020-08-26 DIAGNOSIS — G629 Polyneuropathy, unspecified: Secondary | ICD-10-CM | POA: Diagnosis not present

## 2020-08-26 DIAGNOSIS — M79671 Pain in right foot: Secondary | ICD-10-CM

## 2020-08-26 NOTE — Patient Instructions (Signed)

## 2020-08-29 DIAGNOSIS — M722 Plantar fascial fibromatosis: Secondary | ICD-10-CM | POA: Diagnosis not present

## 2020-08-29 MED ORDER — TRIAMCINOLONE ACETONIDE 10 MG/ML IJ SUSP
10.0000 mg | Freq: Once | INTRAMUSCULAR | Status: AC
  Administered 2020-08-29: 10 mg

## 2020-08-29 NOTE — Progress Notes (Signed)
Subjective:   Patient ID: Suzanne Neal, female   DOB: 75 y.o.   MRN: [id number]   HPI Patient presents stating that she has a lot of pain in her right heel and she feels like she has numbness in her feet.  States that the heels been sore for a few months and makes it hard for her to walk on comfortably and has been an ongoing issue.  Patient does not smoke likes to be active   Review of Systems  All other systems reviewed and are negative.       Objective:  Physical Exam Vitals and nursing note reviewed.  Constitutional:      Appearance: She is well-developed.  Pulmonary:     Effort: Pulmonary effort is normal.  Musculoskeletal:        General: Normal range of motion.  Skin:    General: Skin is warm.  Neurological:     Mental Status: She is alert.     Neurovascular status intact muscle strength adequate range of motion within normal limits.  Patient is found to have exquisite discomfort plantar aspect right heel at the insertional point of the tendon into the calcaneus with inflammation fluid around the medial band and mild diminishment sharp dull vibratory bilateral.  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

## 2020-12-23 ENCOUNTER — Other Ambulatory Visit: Payer: Self-pay

## 2020-12-23 ENCOUNTER — Ambulatory Visit
Admission: EM | Admit: 2020-12-23 | Discharge: 2020-12-23 | Disposition: A | Discharge status: Home / Self Care | Payer: Medicare HMO | Attending: Physician Assistant | Admitting: Physician Assistant

## 2020-12-23 DIAGNOSIS — Z113 Encounter for screening for infections with a predominantly sexual mode of transmission: Secondary | ICD-10-CM | POA: Diagnosis not present

## 2020-12-23 DIAGNOSIS — N898 Other specified noninflammatory disorders of vagina: Secondary | ICD-10-CM | POA: Diagnosis not present

## 2020-12-23 DIAGNOSIS — N765 Ulceration of vagina: Secondary | ICD-10-CM | POA: Diagnosis not present

## 2020-12-23 DIAGNOSIS — R8290 Unspecified abnormal findings in urine: Secondary | ICD-10-CM | POA: Diagnosis not present

## 2020-12-23 LAB — POCT URINALYSIS DIP (MANUAL ENTRY)
Bilirubin, UA: NEGATIVE
Glucose, UA: NEGATIVE mg/dL
Ketones, POC UA: NEGATIVE mg/dL
Nitrite, UA: NEGATIVE
Protein Ur, POC: NEGATIVE mg/dL
Spec Grav, UA: >=1.03 — AB (ref 1.010–1.025)
Urobilinogen, UA: 0.2 E.U./dL
pH, UA: 6 (ref 5.0–8.0)

## 2020-12-23 MED ORDER — ACYCLOVIR 5 % EX CREA: 1.0000 "application " | TOPICAL_CREAM | Freq: Every day | CUTANEOUS | 0 refills | Status: DC

## 2020-12-23 MED ORDER — VALACYCLOVIR HCL 1 G PO TABS: 1000.0000 mg | ORAL_TABLET | Freq: Two times a day (BID) | ORAL | 0 refills | Status: AC

## 2020-12-23 NOTE — ED Provider Notes (Signed)
[facility]    CSN: [id number] Arrival date & time: 12/23/20  1322      History   Chief Complaint Chief Complaint  Patient presents with   vaginal irritation    HPI Suzanne Neal is a 75 y.o. female.   Patient presents today with a 4-day history of vaginal pain.  Reports that she had unprotected sex approximately 4 days ago and soon after that developed an irritated lesion on her labia.  She then applied hydrocortisone cream and triamcinolone cream which exacerbated symptoms.  She reports this area is very tender to touch with pain rated 10 on a 0-10 pain scale, described as burning, worse with palpation, no leaving factors identified.  She denies episodes of similar symptoms in the past.  She is open to complete STI panel today.  She does report significant burning when she goes to the bathroom but states this is only when urine touches lesion denies any ongoing urinary tract infection symptoms.  She denies history of herpes but is concerned for that given clinical presentation.  She denies any changes to personal hygiene products including soaps or detergents.  Denies any history of autoimmune conditions, immunosuppression, chemotherapy.   Past Medical History:  Diagnosis Date   Arthritis    Leukopenia    Thyroid disease     Patient Active Problem List   Diagnosis Date Noted   Thyroid nodule 08/05/2019   Leucopenia 08/16/2011   Arthritis 08/16/2011    Past Surgical History:  Procedure Laterality Date   CARPAL TUNNEL RELEASE  2007   Bilaterally   KNEE ARTHROSCOPY  2009   Left knee    OB History   No obstetric history on file.      Home Medications    Prior to Admission medications   Medication Sig Start Date End Date Taking? Authorizing Provider  linaclotide Karlene Einstein) 290 MCG CAPS capsule 1 09/07/11  Yes [provider]  valACYclovir (VALTREX) 1000 MG tablet Take 1 tablet (1,000 mg total) by mouth 2 (two) times daily for 7 days. 12/23/20  12/30/20 Yes Prophet Renwick K, PA-C  albuterol (PROVENTIL HFA;VENTOLIN HFA) 108 (90 BASE) MCG/ACT inhaler Inhale 1 puff into the lungs as needed.    [provider]  albuterol (PROVENTIL) (2.5 MG/3ML) 0.083% nebulizer solution Take 2.5 mg by nebulization as needed for shortness of breath.     [provider]  fluticasone (FLONASE) 50 MCG/ACT nasal spray Place 1 spray into both nostrils as needed for allergies or rhinitis. Patient not taking: Reported on 08/26/2020    [provider]  ibuprofen (ADVIL,MOTRIN) 800 MG tablet Take 800 mg by mouth as directed. Take  2 - 3  Times per week. Patient not taking: Reported on 08/26/2020    [provider]  linaclotide Karlene Einstein) 290 MCG CAPS capsule Take 290 mcg by mouth daily before breakfast. Patient not taking: Reported on 08/26/2020    [provider]  lisinopril (ZESTRIL) 5 MG tablet Take 5 mg by mouth daily. 10/01/20   [provider]  Methylsulfonylmethane (MSM PO) Take 1 tablet by mouth as needed (for joint health).    [provider]  OVER THE COUNTER MEDICATION Take 1 tablet by mouth as needed Jacqualine Code" for sleep).    [provider]    Family History Family History  Problem Relation Age of Onset   Diabetes Mother    Dementia Mother    Arthritis Mother    Sarcoidosis Maternal Grandmother    Rheum arthritis  Maternal Grandmother    Arthritis Maternal Grandmother    Breast cancer Neg Hx    Thyroid disease Neg Hx     Social History Social History   Tobacco Use   Smoking status: Former    Years: 29.00    Types: Cigarettes    Quit date: 08/16/1983    Years since quitting: 37.3   Smokeless tobacco: Never  Substance Use Topics   Alcohol use: No   Drug use: No     Allergies   Patient has no known allergies.   Review of Systems Review of Systems  Constitutional:  Negative for activity change, appetite change, fatigue and fever.  Respiratory:  Negative for cough.    Cardiovascular:  Negative for chest pain.  Gastrointestinal:  Negative for abdominal pain, diarrhea, nausea and vomiting.  Genitourinary:  Positive for genital sores, vaginal discharge and vaginal pain. Negative for dysuria, frequency, pelvic pain, urgency and vaginal bleeding.  Neurological:  Negative for dizziness, light-headedness and headaches.    Physical Exam Triage Vital Signs ED Triage Vitals  Enc Vitals Group     BP 12/23/20 1501 (!) 146/88     Pulse Rate 12/23/20 1501 65     Resp 12/23/20 1501 18     Temp 12/23/20 1501 98 F (36.7 C)     Temp Source 12/23/20 1501 Oral     SpO2 12/23/20 1501 97 %     Weight --      Height --      Head Circumference --      Peak Flow --      Pain Score 12/23/20 1507 10     Pain Loc --      Pain Edu? --      Excl. in GC? --    No data found.  Updated Vital Signs BP (!) 146/88 (BP Location: Left Arm)   Pulse 65   Temp 98 F (36.7 C) (Oral)   Resp 18   SpO2 97%   Visual Acuity Right Eye Distance:   Left Eye Distance:   Bilateral Distance:    Right Eye Near:   Left Eye Near:    Bilateral Near:     Physical Exam Vitals reviewed.  Constitutional:      General: She is awake. She is not in acute distress.    Appearance: Normal appearance. She is normal weight. She is not ill-appearing.     Comments: Very pleasant female appears stated age no acute distress sitting comfortably in exam room  HENT:     Head: Normocephalic and atraumatic.  Cardiovascular:     Rate and Rhythm: Normal rate and regular rhythm.     Heart sounds: Normal heart sounds, S1 normal and S2 normal. No murmur heard. Pulmonary:     Effort: Pulmonary effort is normal.     Breath sounds: Normal breath sounds. No wheezing, rhonchi or rales.     Comments: Clear to auscultation bilaterally Abdominal:     General: Bowel sounds are normal.     Palpations: Abdomen is soft.     Tenderness: There is no abdominal tenderness. There is no right CVA tenderness, left  CVA tenderness, guarding or rebound.     Comments: Benign abdominal exam  Genitourinary:    Labia:        Right: No rash or lesion.        Left: Rash and lesion present.      Comments: Ulcerated lesion noted superior left labia minora.  Area is exquisitely  tender to touch.  No additional lesions noted. Psychiatric:        Behavior: Behavior is cooperative.     UC Treatments / Results  Labs (all labs ordered are listed, but only abnormal results are displayed) Labs Reviewed  POCT URINALYSIS DIP (MANUAL ENTRY) - Abnormal; Notable for the following components:      Result Value   Spec Grav, UA >=1.030 (*)    Blood, UA small (*)    Leukocytes, UA Small (1+) (*)    All other components within normal limits  URINE CULTURE  HSV CULTURE AND TYPING  CERVICOVAGINAL ANCILLARY ONLY    EKG   Radiology No results found.  Procedures Procedures (including critical care time)  Medications Ordered in UC Medications - No data to display  Initial Impression / Assessment and Plan / UC Course  I have reviewed the triage vital signs and the nursing notes.  Pertinent labs & imaging results that were available during my care of the patient were reviewed by me and considered in my medical decision making (see chart for details).      Ulcerated lesion was noted on exam.  We will treat for herpes simplex with Valtrex 1000 mg twice daily for 7 days.  She was encouraged to use conservative treatment measures including over-the-counter analgesics and cool compresses for additional symptom relief.  UA obtained showed trace leukocyte esterase and blood likely related to irritation from genital lesion.  She is not having any urinary tract infection symptoms so we will defer antibiotic treatment until urine culture obtained.  HSV typing collected based on ulcerated lesion today.  STI swab collected will defer additional treatment until results are obtained.  Discussed alarm symptoms that warrant emergent  evaluation.  Strict return precautions given to which patient expressed understanding.  Final Clinical Impressions(s) / UC Diagnoses   Final diagnoses:  Vaginal irritation  Vaginal ulcer  Routine screening for STI (sexually transmitted infection)  Abnormal urinalysis     Discharge Instructions      We will contact you with your lab results.  Use Valtrex twice daily for 7 days.  Use cool compresses on the area.  If you have any spread of symptoms or additional symptoms including fever, nausea, vomiting, urinary symptoms you need to be reevaluated.     ED Prescriptions     Medication Sig Dispense Auth. Provider   valACYclovir (VALTREX) 1000 MG tablet Take 1 tablet (1,000 mg total) by mouth 2 (two) times daily for 7 days. 14 tablet Marteze Vecchio, Noberto Retort, PA-C      PDMP not reviewed this encounter.   Jeani Hawking, PA-C 12/23/20 1535

## 2020-12-23 NOTE — ED Triage Notes (Signed)
Four days ago, Pt reports that she engaged in unprotected sex. The next day she felt a "scratch" on her labia so she applied triamcinolone cream that worsened the area. The next day she applied another OTC cream that worsened the sxs. Pt has also tried at home remedies. Denies lesions, but notes pink discoloration. Pt expressed concern for herpes. Notes onset this morning of discharge. Denies abdominal pain, hematuria, vaginal itching, dysuria, urinary frequency and urgency.

## 2020-12-23 NOTE — Discharge Instructions (Addendum)
We will contact you with your lab results.  Use Valtrex twice daily for 7 days.  Use cool compresses on the area.  If you have any spread of symptoms or additional symptoms including fever, nausea, vomiting, urinary symptoms you need to be reevaluated.

## 2020-12-24 ENCOUNTER — Telehealth: Payer: Self-pay

## 2020-12-24 LAB — CERVICOVAGINAL ANCILLARY ONLY
Bacterial Vaginitis (gardnerella): POSITIVE — AB
Candida Glabrata: NEGATIVE
Candida Vaginitis: NEGATIVE
Chlamydia: NEGATIVE
Comment: NEGATIVE
Comment: NEGATIVE
Comment: NEGATIVE
Comment: NEGATIVE
Comment: NEGATIVE
Comment: NORMAL
Neisseria Gonorrhea: NEGATIVE
Trichomonas: NEGATIVE

## 2020-12-24 MED ORDER — METRONIDAZOLE 500 MG PO TABS: 500.0000 mg | ORAL_TABLET | Freq: Two times a day (BID) | ORAL | 0 refills | Status: DC

## 2020-12-25 LAB — HSV CULTURE AND TYPING

## 2020-12-25 LAB — URINE CULTURE

## 2020-12-27 MED ORDER — METRONIDAZOLE 0.75 % VA GEL: 1.0000 | Freq: Every day | VAGINAL | 0 refills | Status: AC

## 2021-01-02 ENCOUNTER — Other Ambulatory Visit: Payer: Self-pay | Admitting: Endocrinology

## 2021-01-02 DIAGNOSIS — E041 Nontoxic single thyroid nodule: Secondary | ICD-10-CM

## 2021-01-02 DIAGNOSIS — R7303 Prediabetes: Secondary | ICD-10-CM

## 2021-01-04 ENCOUNTER — Other Ambulatory Visit: Payer: Medicare HMO

## 2021-01-13 ENCOUNTER — Ambulatory Visit: Payer: Medicare HMO | Admitting: Endocrinology

## 2021-01-17 ENCOUNTER — Ambulatory Visit (INDEPENDENT_AMBULATORY_CARE_PROVIDER_SITE_OTHER): Payer: Medicare HMO | Admitting: Family Medicine

## 2021-01-17 ENCOUNTER — Other Ambulatory Visit: Payer: Self-pay

## 2021-01-17 ENCOUNTER — Encounter: Payer: Self-pay | Admitting: Family Medicine

## 2021-01-17 VITALS — BP 147/85 | HR 72 | Temp 98.3°F | Resp 18 | Ht 65.98 in | Wt 201.4 lb

## 2021-01-17 DIAGNOSIS — M792 Neuralgia and neuritis, unspecified: Secondary | ICD-10-CM

## 2021-01-17 DIAGNOSIS — R7303 Prediabetes: Secondary | ICD-10-CM | POA: Diagnosis not present

## 2021-01-17 DIAGNOSIS — Z7689 Persons encountering health services in other specified circumstances: Secondary | ICD-10-CM

## 2021-01-17 DIAGNOSIS — R143 Flatulence: Secondary | ICD-10-CM | POA: Insufficient documentation

## 2021-01-17 DIAGNOSIS — K5900 Constipation, unspecified: Secondary | ICD-10-CM | POA: Insufficient documentation

## 2021-01-17 DIAGNOSIS — R1310 Dysphagia, unspecified: Secondary | ICD-10-CM | POA: Insufficient documentation

## 2021-01-17 DIAGNOSIS — R141 Gas pain: Secondary | ICD-10-CM | POA: Insufficient documentation

## 2021-01-17 DIAGNOSIS — R109 Unspecified abdominal pain: Secondary | ICD-10-CM | POA: Insufficient documentation

## 2021-01-17 DIAGNOSIS — K219 Gastro-esophageal reflux disease without esophagitis: Secondary | ICD-10-CM | POA: Insufficient documentation

## 2021-01-17 LAB — POCT GLYCOSYLATED HEMOGLOBIN (HGB A1C): Hemoglobin A1C: 5.9 % — AB (ref 4.0–5.6)

## 2021-01-17 LAB — GLUCOSE, POCT (MANUAL RESULT ENTRY): POC Glucose: 93 mg/dl (ref 70–99)

## 2021-01-17 MED ORDER — GABAPENTIN 300 MG PO CAPS: 300.0000 mg | ORAL_CAPSULE | Freq: Three times a day (TID) | ORAL | 0 refills | Status: DC

## 2021-01-17 NOTE — Progress Notes (Signed)
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

## 2021-01-19 NOTE — Progress Notes (Signed)
New Patient Office Visit  Subjective:  Patient ID: Suzanne Neal, female    DOB: 01-Jun-1946  Age: 75 y.o. MRN: [id number]  CC:  Chief Complaint  Patient presents with   Establish Care    HPI Suzanne Neal presents for to establish care. Patient reports some issues with neuropathy for which she desires to discuss and consider medications. She is a proponent of using different modalities including vitamins and supplements to prevent or manage medical issues.   Past Medical History:  Diagnosis Date   Arthritis    Leukopenia    Thyroid disease     Past Surgical History:  Procedure Laterality Date   CARPAL TUNNEL RELEASE  2007   Bilaterally   KNEE ARTHROSCOPY  2009   Left knee    Family History  Problem Relation Age of Onset   Diabetes Mother    Dementia Mother    Arthritis Mother    Sarcoidosis Maternal Grandmother    Rheum arthritis Maternal Grandmother    Arthritis Maternal Grandmother    Breast cancer Neg Hx    Thyroid disease Neg Hx     Social History   Socioeconomic History   Marital status: Single    Spouse name: Not on file   Number of children: Not on file   Years of education: Not on file   Highest education level: Not on file  Occupational History   Not on file  Tobacco Use   Smoking status: Former    Years: 29.00    Types: Cigarettes    Quit date: 08/16/1983    Years since quitting: 37.4   Smokeless tobacco: Never  Vaping Use   Vaping Use: Never used  Substance and Sexual Activity   Alcohol use: Yes    Alcohol/week: 3.0 standard drinks    Types: 3 Standard drinks or equivalent per week   Drug use: No   Sexual activity: Yes  Other Topics Concern   Not on file  Social History Narrative   Not on file   Social Determinants of Health   Financial Resource Strain: Not on file  Food Insecurity: Not on file  Transportation Needs: Not on file  Physical Activity: Not on file  Stress: Not on file  Social Connections: Not on file  Intimate  Partner Violence: Not on file    ROS Review of Systems  Neurological:  Positive for numbness. Negative for weakness.  All other systems reviewed and are negative.  Objective:   Today's Vitals: BP (!) 147/85 (BP Location: Left Arm, Patient Position: Sitting, Cuff Size: Normal)   Pulse 72   Temp 98.3 F (36.8 C)   Resp 18   Ht 5' 5.98" (1.676 m)   Wt 201 lb 6.4 oz (91.4 kg)   SpO2 96%   BMI 32.52 kg/m   Physical Exam Vitals and nursing note reviewed.  Constitutional:      General: She is not in acute distress. Cardiovascular:     Rate and Rhythm: Normal rate and regular rhythm.  Pulmonary:     Effort: Pulmonary effort is normal.     Breath sounds: Normal breath sounds.  Neurological:     General: No focal deficit present.     Mental Status: She is alert and oriented to person, place, and time.     Motor: No weakness.     Gait: Gait normal.    Assessment & Plan:   1. Prediabetes Patient A1c level is at upper level of normal.  Continue  to monitor as necessary.  - POCT glycosylated hemoglobin (Hb A1C) - POCT glucose (manual entry)  2. Neuralgia and neuritis Patient prescribed Neurontin.  Will monitor on this medicine.  - gabapentin (NEURONTIN) 300 MG capsule; Take 1 capsule (300 mg total) by mouth 3 (three) times daily.  Dispense: 90 capsule; Refill: 0  3. Encounter to establish care     Outpatient Encounter Medications as of 01/17/2021  Medication Sig   dexlansoprazole (DEXILANT) 60 MG capsule Take 1   gabapentin (NEURONTIN) 300 MG capsule Take 1 capsule (300 mg total) by mouth 3 (three) times daily.   acyclovir cream (ZOVIRAX) 5 % Apply 1 application topically daily.   albuterol (PROVENTIL HFA;VENTOLIN HFA) 108 (90 BASE) MCG/ACT inhaler Inhale 1 puff into the lungs as needed.   albuterol (PROVENTIL) (2.5 MG/3ML) 0.083% nebulizer solution Take 2.5 mg by nebulization as needed for shortness of breath.    fluticasone (FLONASE) 50 MCG/ACT nasal spray Place 1  spray into both nostrils as needed for allergies or rhinitis. (Patient not taking: Reported on 08/26/2020)   fluticasone-salmeterol (ADVAIR DISKUS) 100-50 MCG/ACT AEPB    hydrOXYzine (ATARAX/VISTARIL) 10 MG tablet Take 10 mg by mouth 3 (three) times daily.   ibuprofen (ADVIL,MOTRIN) 800 MG tablet Take 800 mg by mouth as directed. Take  2 - 3  Times per week. (Patient not taking: Reported on 08/26/2020)   linaclotide (LINZESS) 290 MCG CAPS capsule Take 290 mcg by mouth daily before breakfast. (Patient not taking: Reported on 08/26/2020)   lisinopril (ZESTRIL) 5 MG tablet Take 5 mg by mouth daily.   Methylsulfonylmethane (MSM PO) Take 1 tablet by mouth as needed (for joint health).   omeprazole (PRILOSEC) 20 MG capsule 1   OVER THE COUNTER MEDICATION Take 1 tablet by mouth as needed ("Sleepsure" for sleep).   [DISCONTINUED] linaclotide (LINZESS) 290 MCG CAPS capsule 1   No facility-administered encounter medications on file as of 01/17/2021.    Follow-up: Return in about 6 weeks (around 02/28/2021) for follow up.   Tommie Raymond, MD

## 2021-01-21 ENCOUNTER — Other Ambulatory Visit: Payer: Self-pay

## 2021-01-21 ENCOUNTER — Other Ambulatory Visit: Payer: Medicare HMO

## 2021-01-21 ENCOUNTER — Other Ambulatory Visit (INDEPENDENT_AMBULATORY_CARE_PROVIDER_SITE_OTHER): Payer: Medicare HMO

## 2021-01-21 DIAGNOSIS — E041 Nontoxic single thyroid nodule: Secondary | ICD-10-CM | POA: Diagnosis not present

## 2021-01-21 DIAGNOSIS — R7303 Prediabetes: Secondary | ICD-10-CM | POA: Diagnosis not present

## 2021-01-21 LAB — GLUCOSE, RANDOM: Glucose, Bld: 95 mg/dL (ref 70–99)

## 2021-01-21 LAB — TSH: TSH: 1.01 u[IU]/mL (ref 0.35–5.50)

## 2021-01-25 ENCOUNTER — Other Ambulatory Visit: Payer: Self-pay

## 2021-01-25 ENCOUNTER — Ambulatory Visit (INDEPENDENT_AMBULATORY_CARE_PROVIDER_SITE_OTHER): Payer: Medicare HMO | Admitting: Endocrinology

## 2021-01-25 ENCOUNTER — Encounter: Payer: Self-pay | Admitting: Endocrinology

## 2021-01-25 VITALS — BP 164/90 | HR 65 | Ht 66.0 in | Wt 201.8 lb

## 2021-01-25 DIAGNOSIS — R7303 Prediabetes: Secondary | ICD-10-CM

## 2021-01-25 DIAGNOSIS — E041 Nontoxic single thyroid nodule: Secondary | ICD-10-CM

## 2021-01-25 NOTE — Progress Notes (Signed)
Patient ID: Suzanne Neal, female   DOB: Nov 15, 1945, 75 y.o.   MRN: [id number]             Reason for Appointment: f/u  of thyroid nodule    History of Present Illness:    The patient's thyroid nodule was first discovered in late 2019 At that time she was having a home visit from a nurse and had not noticed the swelling herself  The thyroid ultrasound in 07/2018 showed the following Right thyroid nodule- solid, heterogeneous 5.4x2.7x4.1cm smooth Borders, this was reported as 5.8 cm at another location in 06/2018. The nodule was isoechoic and almost completely solid  Needle aspiration biopsy was done on the right thyroid with II (smears):  Results showed   benign (Bethesda category 2). Consistent with a benign follicular nodule. Specimen Adequacy:  Satisfactory for evaluation. Scant cellularity.  On her initial consultation in 3/21 she was sent for follow-up ultrasound which showed similar results as above with the nodule 6.1 cm She was recommended observation only  She is now asking about dryness in her throat and difficulty talking in the mornings along with a slight choking feeling , this resolves with drinking something warm Otherwise she does not complain of any local pressure symptoms, discomfort or difficulty swallowing She does not feel any increased swelling in the neck  Thyroid functions have been consistently normal   Lab Results  Component Value Date   FREET4 0.73 07/13/2020   TSH 1.01 01/21/2021   TSH 1.45 07/13/2020   TSH 0.87 07/09/2019    Allergies as of 01/25/2021   No Known Allergies      Medication List        Accurate as of January 25, 2021 10:06 AM. If you have any questions, ask your nurse or doctor.          acyclovir cream 5 % Commonly known as: ZOVIRAX Apply 1 application topically daily.   albuterol (2.5 MG/3ML) 0.083% nebulizer solution Commonly known as: PROVENTIL Take 2.5 mg by nebulization as needed for shortness of breath.    albuterol 108 (90 Base) MCG/ACT inhaler Commonly known as: VENTOLIN HFA Inhale 1 puff into the lungs as needed.   dexlansoprazole 60 MG capsule Commonly known as: DEXILANT Take 1   fluticasone 50 MCG/ACT nasal spray Commonly known as: FLONASE Place 1 spray into both nostrils as needed for allergies or rhinitis.   fluticasone-salmeterol 100-50 MCG/ACT Aepb Commonly known as: ADVAIR   gabapentin 300 MG capsule Commonly known as: NEURONTIN Take 1 capsule (300 mg total) by mouth 3 (three) times daily.   hydrOXYzine 10 MG tablet Commonly known as: ATARAX/VISTARIL Take 10 mg by mouth 3 (three) times daily.   ibuprofen 800 MG tablet Commonly known as: ADVIL Take 800 mg by mouth as directed. Take  2 - 3  Times per week.   linaclotide 290 MCG Caps capsule Commonly known as: LINZESS Take 290 mcg by mouth daily before breakfast.   lisinopril 5 MG tablet Commonly known as: ZESTRIL Take 5 mg by mouth daily.   MSM PO Take 1 tablet by mouth as needed (for joint health).   omeprazole 20 MG capsule Commonly known as: PRILOSEC 1   OVER THE COUNTER MEDICATION Take 1 tablet by mouth as needed ("Sleepsure" for sleep).        Allergies: No Known Allergies  Past Medical History:  Diagnosis Date   Arthritis    Leukopenia    Thyroid disease     There is no  history of radiation to the neck in childhood  Past Surgical History:  Procedure Laterality Date   CARPAL TUNNEL RELEASE  2007   Bilaterally   KNEE ARTHROSCOPY  2009   Left knee    Family History  Problem Relation Age of Onset   Diabetes Mother    Dementia Mother    Arthritis Mother    Sarcoidosis Maternal Grandmother    Rheum arthritis Maternal Grandmother    Arthritis Maternal Grandmother    Breast cancer Neg Hx    Thyroid disease Neg Hx     Social History:  reports that she quit smoking about 37 years ago. Her smoking use included cigarettes. She has never used smokeless tobacco. She reports current  alcohol use of about 3.0 standard drinks per week. She reports that she does not use drugs.   Review of Systems  Prediabetes: Her highest A1c has been 6.5 and lowest 5.2 This is now 5.9 Previously followed by PCP She has a strong family history of diabetes  She is trying to eat healthy but was not able to see the dietitian because of lack of insurance coverage for prediabetes She has not had a fasting glucose checked recently  Nonfasting glucose recently was 95 Otherwise lately blood sugars have been normal  Wt Readings from Last 3 Encounters:  01/25/21 201 lb 12.8 oz (91.5 kg)  01/17/21 201 lb 6.4 oz (91.4 kg)  07/16/20 202 lb (91.6 kg)     Lab Results  Component Value Date   HGBA1C 5.9 (A) 01/17/2021   HGBA1C 6.0 07/13/2020   Lab Results  Component Value Date   CREATININE 0.75 06/24/2014      Examination:   BP (!) 164/90   Pulse 65   Ht 5\' 6"  (1.676 m)   Wt 201 lb 12.8 oz (91.5 kg)   SpO2 95%   BMI 32.57 kg/m            THYROID nodule is palpable on the right lobe extending to the isthmus. This measures about 5-5.5 cm, is smooth, fleshy and relatively soft  Left lobe not enlarged Trachea not deviated No stridor, Pemberton sign negative  She has no lymphadenopathy in the neck   No peripheral edema  Assessment/Plan:  Thyroid nodule: She has a large right-sided thyroid nodule, measuring about the same or less compared to her last visit  She is having some difficulty with hoarseness in the morning but this resolves with taking care of her dry throat with warm coffee She does not have symptoms any other time and no difficulty swallowing or choking  Ultrasound of thyroid previously showed TI-RADS score of 3 This has been benign on needle aspiration which showed benign cytology  Her exam is showing the nodule to be relatively soft in consistency and possibly a little smaller than on her last exam  Otherwise exam is unremarkable and no signs of tracheal  compression objectively Also no deviation of the trachea  Since nodule is benign from previous biopsy and ultrasound characteristics show low-grade TI-RADS score does not need any follow-up unless has clear-cut symptoms that are new   Prediabetes: She is concerned about her getting diabetes but not clearly A1c is accurate in her case Previously had been recommended GTT to further evaluate this but she did not go for this  She will have a glucose tolerance test to further evaluate this on Thursday   Sunday 01/25/2021

## 2021-02-28 ENCOUNTER — Ambulatory Visit: Payer: Medicare HMO | Admitting: Family Medicine

## 2021-03-08 ENCOUNTER — Ambulatory Visit: Payer: Medicare HMO | Admitting: Family Medicine

## 2021-03-22 ENCOUNTER — Ambulatory Visit: Payer: Medicare HMO | Attending: Internal Medicine

## 2021-03-22 DIAGNOSIS — Z23 Encounter for immunization: Secondary | ICD-10-CM

## 2021-03-22 NOTE — Progress Notes (Signed)
   Covid-[facility] Vaccination [facility]  Name:  CENA BRUHN    MRN: [id number] DOB: 08-Aug-1945  03/22/2021  Ms. Mano was observed post Covid-19 immunization for 15 minutes without incident. She was provided with Vaccine Information Sheet and instruction to access the V-Safe system.   Ms. Packard was instructed to call [phone] with any severe reactions post vaccine: Difficulty breathing  Swelling of face and throat  A fast heartbeat  A bad rash all over body  Dizziness and weakness

## 2021-03-24 ENCOUNTER — Other Ambulatory Visit: Payer: Self-pay

## 2021-03-24 ENCOUNTER — Ambulatory Visit: Payer: Medicare HMO

## 2021-03-24 DIAGNOSIS — Z23 Encounter for immunization: Secondary | ICD-10-CM

## 2021-03-24 NOTE — Progress Notes (Signed)
Flu vaccine given to patient in Right deltoid. Tolerated well

## 2021-03-29 ENCOUNTER — Ambulatory Visit: Payer: Medicare HMO | Admitting: Family

## 2021-04-19 ENCOUNTER — Other Ambulatory Visit (HOSPITAL_BASED_OUTPATIENT_CLINIC_OR_DEPARTMENT_OTHER): Payer: Self-pay

## 2021-04-19 MED ORDER — MODERNA COVID-19 BIVAL BOOSTER 50 MCG/0.5ML IM SUSP
INTRAMUSCULAR | 0 refills | Status: DC
  Filled 2021-04-19: qty 0.5, 1d supply, fill #0

## 2021-05-04 ENCOUNTER — Ambulatory Visit: Payer: Medicare HMO | Admitting: Family Medicine

## 2021-06-13 DIAGNOSIS — Z20822 Contact with and (suspected) exposure to covid-19: Secondary | ICD-10-CM | POA: Diagnosis not present

## 2021-06-14 DIAGNOSIS — Z20822 Contact with and (suspected) exposure to covid-19: Secondary | ICD-10-CM | POA: Diagnosis not present

## 2021-11-16 DIAGNOSIS — H40033 Anatomical narrow angle, bilateral: Secondary | ICD-10-CM | POA: Diagnosis not present

## 2021-11-16 DIAGNOSIS — H524 Presbyopia: Secondary | ICD-10-CM | POA: Diagnosis not present

## 2021-12-24 LAB — GLUCOSE, POCT (MANUAL RESULT ENTRY): POC Glucose: 99 mg/dl (ref 70–99)

## 2021-12-30 ENCOUNTER — Telehealth: Payer: Self-pay

## 2021-12-30 NOTE — Telephone Encounter (Signed)
Patient called in states she has labs at the end of August. She would like T4-TSH-A1C-Cholestrol to be drawn. Can you place these orders?

## 2021-12-31 ENCOUNTER — Other Ambulatory Visit: Payer: Self-pay | Admitting: Endocrinology

## 2021-12-31 DIAGNOSIS — R7301 Impaired fasting glucose: Secondary | ICD-10-CM

## 2021-12-31 DIAGNOSIS — E7800 Pure hypercholesterolemia, unspecified: Secondary | ICD-10-CM

## 2021-12-31 DIAGNOSIS — E041 Nontoxic single thyroid nodule: Secondary | ICD-10-CM

## 2022-01-06 LAB — HEMOGLOBIN A1C: Hemoglobin A1C: 5.8

## 2022-01-06 LAB — TSH: TSH: 0.84 (ref 0.41–5.90)

## 2022-01-06 LAB — BASIC METABOLIC PANEL: Glucose: 67

## 2022-01-06 LAB — LIPID PANEL: LDL Cholesterol: 165

## 2022-01-24 ENCOUNTER — Other Ambulatory Visit: Payer: Medicare HMO

## 2022-01-26 ENCOUNTER — Ambulatory Visit: Payer: Medicare HMO | Admitting: Endocrinology

## 2022-01-26 ENCOUNTER — Encounter: Payer: Self-pay | Admitting: Endocrinology

## 2022-01-26 VITALS — BP 142/82 | HR 64 | Ht 66.0 in | Wt 200.8 lb

## 2022-01-26 DIAGNOSIS — E041 Nontoxic single thyroid nodule: Secondary | ICD-10-CM

## 2022-01-26 DIAGNOSIS — E7800 Pure hypercholesterolemia, unspecified: Secondary | ICD-10-CM

## 2022-01-26 NOTE — Progress Notes (Signed)
Patient ID: Suzanne Neal, female   DOB: 1945/11/04, 76 y.o.   MRN: [id number]             Reason for Appointment: f/u  of thyroid nodule    History of Present Illness:    The patient's thyroid nodule was first discovered in late 2019 At that time she was having a home visit from a nurse and had not noticed the swelling herself  The thyroid ultrasound in 07/2018 showed the following Right thyroid nodule- solid, heterogeneous 5.4x2.7x4.1cm smooth Borders, this was reported as 5.8 cm at another location in 06/2018. The nodule was isoechoic and almost completely solid  Needle aspiration biopsy was done on the right thyroid with II (smears):  Results showed   benign (Bethesda category 2). Consistent with a benign follicular nodule. Specimen Adequacy:  Satisfactory for evaluation. Scant cellularity.  On her initial consultation in 3/21 she was sent for follow-up ultrasound which showed similar results as above with the nodule 6.1 cm She was recommended observation only  Recently does not complain of any local pressure symptoms, anterior neck discomfort or difficulty swallowing Does not think her neck swelling is increased  Thyroid functions have been consistently normal   Lab Results  Component Value Date   FREET4 0.73 07/13/2020   TSH 0.84 01/06/2022   TSH 1.01 01/21/2021   TSH 1.45 07/13/2020    Allergies as of 01/26/2022   No Known Allergies      Medication List        Accurate as of January 26, 2022 11:59 PM. If you have any questions, ask your nurse or doctor.          acyclovir cream 5 % Commonly known as: ZOVIRAX Apply 1 application topically daily.   albuterol (2.5 MG/3ML) 0.083% nebulizer solution Commonly known as: PROVENTIL Take 2.5 mg by nebulization as needed for shortness of breath.   albuterol 108 (90 Base) MCG/ACT inhaler Commonly known as: VENTOLIN HFA Inhale 1 puff into the lungs as needed.   dexlansoprazole 60 MG capsule Commonly known as:  DEXILANT Take 1   fluticasone 50 MCG/ACT nasal spray Commonly known as: FLONASE Place 1 spray into both nostrils as needed for allergies or rhinitis.   fluticasone-salmeterol 100-50 MCG/ACT Aepb Commonly known as: ADVAIR   gabapentin 300 MG capsule Commonly known as: NEURONTIN Take 1 capsule (300 mg total) by mouth 3 (three) times daily.   hydrOXYzine 10 MG tablet Commonly known as: ATARAX Take 10 mg by mouth 3 (three) times daily.   ibuprofen 800 MG tablet Commonly known as: ADVIL Take 800 mg by mouth as directed. Take  2 - 3  Times per week.   linaclotide 290 MCG Caps capsule Commonly known as: LINZESS Take 290 mcg by mouth daily before breakfast.   lisinopril 5 MG tablet Commonly known as: ZESTRIL Take 5 mg by mouth daily.   Moderna COVID-19 Bival Booster 50 MCG/0.5ML injection Generic drug: COVID-19 mRNA bivalent vaccine (Moderna) Inject into the muscle.   MSM PO Take 1 tablet by mouth as needed (for joint health).   omeprazole 20 MG capsule Commonly known as: PRILOSEC 1   OVER THE COUNTER MEDICATION Take 1 tablet by mouth as needed ("Sleepsure" for sleep).        Allergies: No Known Allergies  Past Medical History:  Diagnosis Date   Arthritis    Leukopenia    Thyroid disease     There is no history of radiation to the neck in childhood  Past Surgical History:  Procedure Laterality Date   CARPAL TUNNEL RELEASE  2007   Bilaterally   KNEE ARTHROSCOPY  2009   Left knee    Family History  Problem Relation Age of Onset   Diabetes Mother    Dementia Mother    Arthritis Mother    Sarcoidosis Maternal Grandmother    Rheum arthritis Maternal Grandmother    Arthritis Maternal Grandmother    Breast cancer Neg Hx    Thyroid disease Neg Hx     Social History:  reports that she quit smoking about 38 years ago. Her smoking use included cigarettes. She has never used smokeless tobacco. She reports current alcohol use of about 3.0 standard drinks of  alcohol per week. She reports that she does not use drugs.   Review of Systems  Prediabetes: Her highest A1c has been 6.5 and lowest 5.2 This is now 5.8 Has been followed by PCP She has a strong family history of diabetes  She is trying to eat healthy but was not able to see the dietitian because of lack of insurance coverage for prediabetes Recent morning glucose 67, not likely fasting Has been able to maintain her weight and is also recently trying to exercise  Wt Readings from Last 3 Encounters:  01/26/22 200 lb 12.8 oz (91.1 kg)  01/25/21 201 lb 12.8 oz (91.5 kg)  01/17/21 201 lb 6.4 oz (91.4 kg)     Lab Results  Component Value Date   HGBA1C 5.8 01/06/2022   HGBA1C 5.9 (A) 01/17/2021   HGBA1C 6.0 07/13/2020   Lab Results  Component Value Date   LDLCALC 165 01/06/2022   CREATININE 0.75 06/24/2014      Examination:   BP (!) 142/82   Pulse 64   Ht 5\' 6"  (1.676 m)   Wt 200 lb 12.8 oz (91.1 kg)   SpO2 99%   BMI 32.41 kg/m            THYROID nodule is palpable on the right lobe extending to the isthmus. This measures about 5.5 cm, is smooth, fleshy and slightly firm  Left lobe not enlarged Trachea not deviated No stridor, Pemberton sign negative  She has no lymphadenopathy in the anterior neck    Assessment/Plan:  Thyroid nodule: She has a large right-sided thyroid nodule, measuring about the same as on her previous visits  Also as before has no local pressure symptoms  Ultrasound of thyroid previously showed TI-RADS score of 3 This has been benign on needle aspiration which showed benign cytology  Her exam is showing  no signs of tracheal compression objectively  Since nodule is benign from previous biopsy and ultrasound characteristics show low-grade TI-RADS score does not need any follow-up ultrasound as of now We will continue annual exams   Prediabetes: Glucose is fairly good consistently and A1c may be falsely high in the prediabetic  range Previously had been recommended GTT to further evaluate this but she does not want to do this  HYPERCHOLESTEROLEMIA: She will continue to discuss with her PCP  01/28/2022

## 2022-02-24 ENCOUNTER — Telehealth: Payer: Self-pay

## 2022-03-26 LAB — COLOGUARD: COLOGUARD: NEGATIVE

## 2023-01-23 ENCOUNTER — Other Ambulatory Visit: Payer: Self-pay | Admitting: Endocrinology

## 2023-01-23 ENCOUNTER — Other Ambulatory Visit: Payer: Medicare HMO

## 2023-01-23 DIAGNOSIS — E041 Nontoxic single thyroid nodule: Secondary | ICD-10-CM

## 2023-01-23 DIAGNOSIS — R7301 Impaired fasting glucose: Secondary | ICD-10-CM

## 2023-01-23 LAB — HEMOGLOBIN A1C: Hgb A1c MFr Bld: 5.9 % (ref 4.6–6.5)

## 2023-01-23 LAB — TSH: TSH: 0.55 u[IU]/mL (ref 0.35–5.50)

## 2023-01-23 LAB — T4, FREE: Free T4: 0.82 ng/dL (ref 0.60–1.60)

## 2023-01-23 LAB — GLUCOSE, RANDOM: Glucose, Bld: 114 mg/dL — ABNORMAL HIGH (ref 70–99)

## 2023-01-25 ENCOUNTER — Encounter: Payer: Self-pay | Admitting: Endocrinology

## 2023-01-25 ENCOUNTER — Ambulatory Visit: Payer: Medicare HMO | Admitting: Endocrinology

## 2023-01-25 VITALS — BP 130/78 | HR 74 | Ht 66.0 in | Wt 189.0 lb

## 2023-01-25 DIAGNOSIS — E041 Nontoxic single thyroid nodule: Secondary | ICD-10-CM

## 2023-01-25 DIAGNOSIS — R7303 Prediabetes: Secondary | ICD-10-CM

## 2023-01-25 NOTE — Progress Notes (Signed)
Patient ID: Suzanne Neal, female   DOB: 10-Jul-1945, 77 y.o.   MRN: [id number]             Reason for Appointment: f/u  of thyroid nodule    History of Present Illness:    The patient's thyroid nodule was first discovered in late 2019 At that time she was having a home visit from a nurse and had not noticed the swelling herself  The thyroid ultrasound in 07/2018 showed the following Right thyroid nodule- solid, heterogeneous 5.4x2.7x4.1cm smooth Borders, this was reported as 5.8 cm at another location in 06/2018. The nodule was isoechoic and almost completely solid  Needle aspiration biopsy was done on the right thyroid with II (smears):  Results showed   benign (Bethesda category 2). Consistent with a benign follicular nodule. Specimen Adequacy:  Satisfactory for evaluation. Scant cellularity.  On her initial consultation in 3/21 she was sent for follow-up ultrasound which showed similar results as above with the nodule 6.1 cm She was recommended observation only  Again she does not have any local pressure symptoms, anterior neck discomfort or difficulty swallowing  She wants to know if her thyroid nodule has increased in size Thyroid functions have been consistently normal   Lab Results  Component Value Date   FREET4 0.82 01/23/2023   FREET4 0.73 07/13/2020   TSH 0.55 01/23/2023   TSH 0.84 01/06/2022   TSH 1.01 01/21/2021    Allergies as of 01/25/2023   No Known Allergies      Medication List        Accurate as of January 25, 2023 11:11 AM. If you have any questions, ask your nurse or doctor.          acyclovir cream 5 % Commonly known as: ZOVIRAX Apply 1 application topically daily.   albuterol (2.5 MG/3ML) 0.083% nebulizer solution Commonly known as: PROVENTIL Take 2.5 mg by nebulization as needed for shortness of breath.   albuterol 108 (90 Base) MCG/ACT inhaler Commonly known as: VENTOLIN HFA Inhale 1 puff into the lungs as needed.    dexlansoprazole 60 MG capsule Commonly known as: DEXILANT Take 1   fluticasone 50 MCG/ACT nasal spray Commonly known as: FLONASE Place 1 spray into both nostrils as needed for allergies or rhinitis.   fluticasone-salmeterol 100-50 MCG/ACT Aepb Commonly known as: ADVAIR   gabapentin 300 MG capsule Commonly known as: NEURONTIN Take 1 capsule (300 mg total) by mouth 3 (three) times daily.   hydrOXYzine 10 MG tablet Commonly known as: ATARAX Take 10 mg by mouth 3 (three) times daily.   ibuprofen 800 MG tablet Commonly known as: ADVIL Take 800 mg by mouth as directed. Take  2 - 3  Times per week.   linaclotide 290 MCG Caps capsule Commonly known as: LINZESS Take 290 mcg by mouth daily before breakfast.   lisinopril 5 MG tablet Commonly known as: ZESTRIL Take 5 mg by mouth daily.   Moderna COVID-19 Bival Booster 50 MCG/0.5ML injection Generic drug: COVID-19 mRNA bivalent vaccine (Moderna) Inject into the muscle.   MSM PO Take 1 tablet by mouth as needed (for joint health).   omeprazole 20 MG capsule Commonly known as: PRILOSEC 1   OVER THE COUNTER MEDICATION Take 1 tablet by mouth as needed ("Sleepsure" for sleep).        Allergies: No Known Allergies  Past Medical History:  Diagnosis Date   Arthritis    Leukopenia    Thyroid disease     There is no  history of radiation to the neck in childhood  Past Surgical History:  Procedure Laterality Date   CARPAL TUNNEL RELEASE  2007   Bilaterally   KNEE ARTHROSCOPY  2009   Left knee    Family History  Problem Relation Age of Onset   Diabetes Mother    Dementia Mother    Arthritis Mother    Sarcoidosis Maternal Grandmother    Rheum arthritis Maternal Grandmother    Arthritis Maternal Grandmother    Breast cancer Neg Hx    Thyroid disease Neg Hx     Social History:  reports that she quit smoking about 39 years ago. Her smoking use included cigarettes. She started smoking about 68 years ago. She has  never used smokeless tobacco. She reports current alcohol use of about 3.0 standard drinks of alcohol per week. She reports that she does not use drugs.   Review of Systems  Prediabetes:  Her highest A1c has been 6.5 and lowest 5.2 This is now 5.9  She has a strong family history of diabetes  Generally following a very healthy diet with restricted carbohydrates and simple sugars She is not able to see the dietitian because of lack of insurance coverage for prediabetes Recent postprandial reading 114 in the lab She thinks that her blood sugars are mostly below 100 fasting and only once recently higher at 107  She is trying to be active with activities like dancing Has lost 11 pounds since last year  Wt Readings from Last 3 Encounters:  01/25/23 189 lb (85.7 kg)  01/26/22 200 lb 12.8 oz (91.1 kg)  01/25/21 201 lb 12.8 oz (91.5 kg)     Lab Results  Component Value Date   HGBA1C 5.9 01/23/2023   HGBA1C 5.8 01/06/2022   HGBA1C 5.9 (A) 01/17/2021   Lab Results  Component Value Date   LDLCALC 165 01/06/2022   CREATININE 0.75 06/24/2014      Examination:   BP 130/78   Pulse 74   Ht 5\' 6"  (1.676 m)   Wt 189 lb (85.7 kg)   SpO2 97%   BMI 30.51 kg/m            THYROID nodule is visible on the right lobe extending to the isthmus. This measures about 5.5-6 cm, is smooth, fleshy and slightly firm  Left lobe not palpable Trachea slightly deviated to the left No stridor  She has no lymphadenopathy in the anterior neck    Assessment/Plan:  Thyroid nodule: She has a large right-sided thyroid nodule, clinically not appearing to be enlarging Again as before has no local pressure symptoms in the neck  Ultrasound of thyroid previously showed TI-RADS score of 3 This has been benign on needle aspiration which showed benign cytology  Since nodule is benign from previous biopsy and ultrasound characteristics show low-grade TI-RADS score does not need any follow-up  ultrasound exams We will continue annual clinical exams  Prediabetes: Blood sugar by home monitoring is fairly good consistently  A1c may be falsely high in the prediabetic range Previously had been recommended GTT to further evaluate this but she does not want to do this She will continue to maintain her weight loss and follow-up in 1 year   Reather Littler 01/25/2023

## 2023-01-25 NOTE — Patient Instructions (Signed)
Check blood sugars on waking up 2-3 days a week  Also check blood sugars about 2 hours after meals and do this after different meals by rotation  Recommended blood sugar levels on waking up are 70-110 and about 2 hours after meal is 140  Please bring your blood sugar monitor to each visit, thank you

## 2023-01-30 ENCOUNTER — Other Ambulatory Visit: Payer: Medicare HMO

## 2023-02-01 ENCOUNTER — Ambulatory Visit: Payer: Medicare HMO | Admitting: Endocrinology

## 2024-01-22 ENCOUNTER — Other Ambulatory Visit: Payer: Medicare HMO

## 2024-01-29 ENCOUNTER — Other Ambulatory Visit: Payer: Self-pay

## 2024-01-29 ENCOUNTER — Telehealth: Payer: Self-pay

## 2024-01-29 ENCOUNTER — Encounter: Payer: Self-pay | Admitting: Endocrinology

## 2024-01-29 ENCOUNTER — Ambulatory Visit: Payer: Medicare HMO | Admitting: Endocrinology

## 2024-01-29 VITALS — BP 156/78 | HR 66 | Resp 20 | Ht 66.0 in | Wt 193.0 lb

## 2024-01-29 DIAGNOSIS — E041 Nontoxic single thyroid nodule: Secondary | ICD-10-CM | POA: Diagnosis not present

## 2024-01-29 DIAGNOSIS — E042 Nontoxic multinodular goiter: Secondary | ICD-10-CM | POA: Diagnosis not present

## 2024-01-29 DIAGNOSIS — R7303 Prediabetes: Secondary | ICD-10-CM

## 2024-01-29 NOTE — Progress Notes (Signed)
 During patient's office visit, patient stated she got labs from the office a week prior, however when looking in patient's chart there were no orders placed for labs noted. Patient gave a description of the lab tech who drew the labs and stated the name was Camie. [facility] lab tech checked their system while Camie was at lunch and did not find patient in their system at all as having been drawn.   Front desk staff stated they recall seeing her and checking her in on the date she had scheduled, however their were no orders in the system for patient.   RN looked back to see if there was a standing future order from the prior MD, however there was not.   Spoke with the manager at [employer] who stated she looked in their system dating back to last year and still don't have the patient in their system.   Patient agreed to have labs redrawn but was upset that the prior labs could not be accounted for.

## 2024-01-29 NOTE — Telephone Encounter (Signed)
 Patient made aware that after speaking to manager and lab tech that she was still not found. Patient read from paper she received on 8/19 that had her f/u appt for office visit today on it. RN apologized again and asked patient if her experience today went well because patient stated she is usually a hard stick. Patient stated it went very well no pain or issues to report regarding lab draws.

## 2024-01-29 NOTE — Progress Notes (Signed)
 Outpatient Endocrinology Note [place] Yaris Ferrell, MD   Patient's Name: DEAJA Neal    DOB: 02/21/1946    MRN: [id number]  REASON OF VISIT: Follow-up for thyroid  nodule  PCP: Campbell Reynolds, NP  HISTORY OF PRESENT ILLNESS:   Suzanne Neal is a 78 y.o. old female with past medical history as listed below is presented for a follow up for thyroid  nodules.  Pertinent Thyroid  History:  Patient was previously and was last time seen in January 25, 2023 by Dr. Von.  Patient's thyroid  nodule was first discovered in late 2019 at that time she was having home visit from a nurse.  She subsequently had ultrasound thyroid  in February 2020.  The thyroid  ultrasound in 07/2018 showed the following :  Right thyroid  nodule- solid, heterogeneous 5.8x4.4x2.2cm smooth Borders and left inferior thyroid  nodule measuring 1.2 x 1.0 x 0.9 cm.     Needle aspiration biopsy was done on the right thyroid  with II (smears) at [facility] in July 18, 2018: On my review no report available to review.  Results showed   benign (Bethesda category 2). Consistent with a benign follicular nodule. Specimen Adequacy:  Satisfactory for evaluation. Scant cellularity.   On her initial consultation with endocrinology in 08/05/2019 she was sent for follow-up ultrasound which showed similar results as above with the nodule 6.1 cm and left inferior thyroid  nodule measuring 1.8 cm.  Thyroid  functions have been consistently normal, she has been euthyroid.  Has not been on thyroid  medication.  - Patient denies h/o significant radiation exposure or radiation treatment in the neck.  - Thyroid  US  on June 20, 2018 and March 15 or 20, 2021: reviewed images.  - Labs:   Latest Reference Range & Units 01/23/23 14:20  TSH 0.35 - 5.50 uIU/mL 0.55  T4,Free(Direct) 0.60 - 1.60 ng/dL 9.17   # Prediabetes : Mostly A1c in the range of 5.8 to 6% range based on chart.  Strong family history of diabetes.  On healthy diet with  restricted carbohydrates and simple sugars.  She is trying to be active with activities like dancing.  Hemoglobin A1c was 5.9% in August 2024.  Interval history  Patient presented for the annual follow-up.  She was seen last time in this clinic by Dr. Von in August 2024.  Patient has large right thyroid  nodule.  Last ultrasound thyroid  was done in March 2021.  She denies any neck compressive symptoms, no dysphagia, shortness of breath in any position or choking.  She is not truly sure however she may have felt some increase in right thyroid  nodule.  She is overall feeling usual state of health, no hypo and hyperthyroid symptoms.  Patient reports that she had lab visit last Tuesday and had blood draw for thyroid  function test.  However no results available to review.  On looking to the chart further, she had lab visit on January 22, 2024.  There has been no lab orders or any standing orders on the chart.  Patient is upset about not having results of her recent lab work.   It is very unusual to have blood draw without lab orders. Talked with the [facility] lab staff in our clinic along with nursing staff, also informed clinical administrator.   We apologize with the patient for not having lab results, told that we need to investigate further to find out how it happened and will let the patient know.  Since there has been no lab results, we discussed and patient agreed  and he is willing to redraw blood for the tests.  REVIEW OF SYSTEMS:  As per history of present illness.   PAST MEDICAL HISTORY: Past Medical History:  Diagnosis Date   Arthritis    Leukopenia    Thyroid  disease     PAST SURGICAL HISTORY: Past Surgical History:  Procedure Laterality Date   CARPAL TUNNEL RELEASE  2007   Bilaterally   KNEE ARTHROSCOPY  2009   Left knee    ALLERGIES: No Known Allergies  FAMILY HISTORY:  Family History  Problem Relation Age of Onset   Diabetes Mother    Dementia Mother    Arthritis Mother     Sarcoidosis Maternal Grandmother    Rheum arthritis Maternal Grandmother    Arthritis Maternal Grandmother    Breast cancer Neg Hx    Thyroid  disease Neg Hx     SOCIAL HISTORY: Social History   Socioeconomic History   Marital status: Single    Spouse name: Not on file   Number of children: Not on file   Years of education: Not on file   Highest education level: Not on file  Occupational History   Not on file  Tobacco Use   Smoking status: Former    Current packs/day: 0.00    Types: Cigarettes    Start date: 08/16/1954    Quit date: 08/16/1983    Years since quitting: 40.4   Smokeless tobacco: Never  Vaping Use   Vaping status: Never Used  Substance and Sexual Activity   Alcohol use: Yes    Alcohol/week: 3.0 standard drinks of alcohol    Types: 3 Standard drinks or equivalent per week   Drug use: No   Sexual activity: Yes  Other Topics Concern   Not on file  Social History Narrative   Not on file   Social Drivers of Health   Financial Resource Strain: Not on file  Food Insecurity: Not on file  Transportation Needs: Not on file  Physical Activity: Not on file  Stress: Not on file  Social Connections: Not on file    MEDICATIONS:  Current Outpatient Medications  Medication Sig Dispense Refill   cycloSPORINE (RESTASIS) 0.05 % ophthalmic emulsion as needed.     No current facility-administered medications for this visit.    PHYSICAL EXAM: Vitals:   01/29/24 1103  BP: (!) 156/78  Pulse: 66  Resp: 20  SpO2: 96%  Weight: 193 lb (87.5 kg)  Height: 5' 6 (1.676 m)   Body mass index is 31.15 kg/m.  Wt Readings from Last 3 Encounters:  01/29/24 193 lb (87.5 kg)  01/25/23 189 lb (85.7 kg)  01/26/22 200 lb 12.8 oz (91.1 kg)     General: Well developed, well nourished female in no apparent distress.  HEENT: AT/[place], no external lesions. Hearing intact to the spoken word Eyes: EOMI. No stare, proptosis. Conjunctiva clear and no icterus. Neck: Trachea shift to  left, neck supple with appreciable right thyromegaly or no lymphadenopathy and right palpable thyroid  nodule ~ 5 cm, mobile, non tender. Abdomen: Soft Neurologic: Alert, oriented, normal speech, deep tendon biceps reflexes normal,  no gross focal neurological deficit Extremities: No pedal pitting edema, no tremors of outstretched hands Skin: Warm, color good. Psychiatric: Does not appear depressed or anxious  PERTINENT HISTORIC LABORATORY AND IMAGING STUDIES:  All pertinent laboratory results were reviewed. Please see HPI also for further details.   TSH  Date Value Ref Range Status  01/23/2023 0.55 0.35 - 5.50 uIU/mL Final  01/06/2022  0.84 0.41 - 5.90 Final  01/21/2021 1.01 0.35 - 5.50 uIU/mL Final     ASSESSMENT / PLAN  1. Right thyroid  nodule   2. Multiple thyroid  nodules   3. Prediabetes    Patient has large right thyroid  nodule measuring around 6 cm, discovered in beginning of 2020, repeat ultrasound in March 2021 with relatively stable right thyroid  nodule, that has been left thyroid  nodule solid inferior measuring 1.8 cm as well.  She underwent FNA of right thyroid  nodule in February 2020 with benign cytology.  Based on prior endocrinology note she had been reluctant to consider surgery.  -She denies neck compressive symptoms.  No family history of thyroid  cancer.  No radiation exposure.  Is euthyroid not on thyroid  medication.  # She has prediabetes on diet control /lifestyle modification.  Plan: - I would like to check ultrasound thyroid  to monitor thyroid  nodules including right and left.  Last ultrasound was done in March 2021. - Check thyroid  function test today. - Will check hemoglobin A1c today. - Annual endocrinology follow-up.  Patient had lab visit on August 19, not able to have results, detailed in HPI, still need to find out what happened, informed Research officer, political party and nursing staff RN and the [facility] lab.  We discussed and patient agreed and is willing to  have the blood for the test today.  Diagnoses and all orders for this visit:  Right thyroid  nodule -     US  THYROID ; Future -     T4, free -     TSH  Multiple thyroid  nodules  Prediabetes -     Hemoglobin A1c    DISPOSITION Follow up in clinic in 12 months suggested.  All questions answered and patient verbalized understanding of the plan.  [place] Iliyah Bui, MD [facility] Endocrinology [facility] [address] [place], [address] [place], [place] [address] Phone # [phone]  At least part of this note was generated using voice recognition software. Inadvertent word errors may have occurred, which were not recognized during the proofreading process.

## 2024-01-29 NOTE — Telephone Encounter (Signed)
 Spoke to [employer] x 2. Manager noticed the labs were drawn and placed under a different patient's name.   [facility] will do a safety portal for their system. Safety portal and IT ticket will be done by RN.

## 2024-01-30 ENCOUNTER — Telehealth: Payer: Self-pay

## 2024-01-30 ENCOUNTER — Ambulatory Visit: Payer: Self-pay | Admitting: Endocrinology

## 2024-01-30 LAB — HEMOGLOBIN A1C
Hgb A1c MFr Bld: 5.8 % — ABNORMAL HIGH (ref <5.7)
Mean Plasma Glucose: 120 mg/dL
eAG (mmol/L): 6.6 mmol/L

## 2024-01-30 LAB — T4, FREE: Free T4: 1.1 ng/dL (ref 0.8–1.8)

## 2024-01-30 LAB — TSH: TSH: 0.74 m[IU]/L (ref 0.40–4.50)

## 2024-01-30 NOTE — Telephone Encounter (Signed)
 VM left explaining what happened to patient's lab values from the 19th. Will call patient back later to see if she has any questions after listening to VM.

## 2024-01-31 ENCOUNTER — Encounter: Payer: Self-pay | Admitting: Endocrinology

## 2024-02-01 ENCOUNTER — Inpatient Hospital Stay: Admission: RE | Admit: 2024-02-01 | Source: Ambulatory Visit

## 2024-02-01 ENCOUNTER — Other Ambulatory Visit

## 2024-03-11 ENCOUNTER — Other Ambulatory Visit: Payer: Self-pay

## 2024-03-11 NOTE — Patient Outreach (Signed)
 Aging Gracefully Program  03/11/2024  BRISEIDA GITTINGS 1945/12/09 [id number]   [facility] Evaluation Interviewer attempted to call patient on today regarding Aging Gracefully referral. No answer from patient after multiple rings. CMA left confidential voicemail for patient to return call.  Will attempt to call back tomorrow.    Shereen [facility]  Population Health Care Management Assistant  Direct Dial: [phone]  Fax: [phone] Website: [email or web address]

## 2024-03-19 ENCOUNTER — Other Ambulatory Visit: Payer: Self-pay

## 2024-03-19 NOTE — Patient Outreach (Signed)
 Aging Gracefully Program  03/19/2024  Suzanne Neal Jun 10, 1945 [id number]   [facility] Evaluation Interviewer attempted to call patient on today regarding Aging Gracefully referral. No answer from patient after multiple rings. CMA left confidential voicemail for patient to return call.  Will attempt to call back within 1 week.    Shereen [facility]  Population Health Care Management Assistant  Direct Dial: [phone]  Fax: [phone] Website: [email or web address]

## 2024-03-21 ENCOUNTER — Other Ambulatory Visit: Payer: Self-pay

## 2024-03-21 NOTE — Patient Outreach (Signed)
 Aging Gracefully Program  03/21/2024  LINDEY RENZULLI 1946-02-16 [id number]   [facility] Evaluation Interviewer made contact with patient. Aging Gracefully survey completed.   Interviewer will send referral to RN and OT for follow up.   Shereen [facility]  Population Health Care Management Assistant  Direct Dial: [phone]  Fax: [phone] Website: [email or web address]

## 2024-04-07 ENCOUNTER — Other Ambulatory Visit: Admitting: Occupational Therapy

## 2024-04-10 NOTE — Patient Instructions (Signed)
 Goals Addressed             This Visit's Progress    Patient Stated       She would like to feel more safe in her bathroom(s). Master bath this could be accomplished by adding grab bars at/in her walk in shower, adding in a lower handheld shower holder, and getting her a shower seat. Also in this bathroom she would benefit from a higher toilet.      Patient Stated       She would like to feel safer getting in and out of the house. Front door: railing needs to be secured, needs an extra rail at from sidewalk to step down onto driveway, some bricks need cement between them to secure them. Side door at driveway: needs a new screen and main door. Back door to back yard: needs a rail and cement between some of the bricks, is there a way to make sliding glass door and screen slide easier?

## 2024-04-10 NOTE — Patient Outreach (Signed)
 Aging Gracefully Program  OT Initial Visit  04/10/2024  Suzanne Neal [birth year] [id number]  Visit:  1- Initial Visit  Start Time:  1330 End Time:  1445 Total Minutes:  75  CCAP: Typical Daily Routine: Typical Daily Routine:: Gets out and about each day or does something within the house--currently painting bedrooms What Types Of Care Problems Are You Having Throughout The Day?: painful left knee making it hard to step in and out of walk in shower and getting up and down from toiler What Kind Of Help Do You Receive?: none What Do You Think Would Make Everyday Life Easier For You?: grab bars for shower and higher toilet What Is A Good Day Like?: less pain in knee What Is A Bad Day Like?: more pain in knee Patient Reported Equipment: Patient Reported Equipment Currently Used:  (none) Functional Mobility-Maintain Balance While Showering: Maintaining Balance While Showering: A Little Difficulty Do You:: No Device/No Assistance Importance Of Learning New Strategies:: Very Much Intervention: Yes Other Comments:: grab bars and shower seat Functional Mobility-Move In And Out Of Bath/Shower: Move In And Out Of A Bath/Shower: Moderate Difficulty Do You:: No Device/No Assistance Importance Of Learning New Strategies:: Very Much Intervention: Yes Other Comments:: grab bars  Readiness To Change Score:  Readiness to Change Score: 10  Home Environment Assessment: Outside Home Entry:: Front entrance--railing is loose; side entrance-needs new storm and regular door; back entrance--sliding door and screen do not slide well and she needs a rail Bathroom:: walk in shower in master, no grab bars. Low toilet in bathoom but does have grab bar beside it Laundry:: off den area--down 2 steps to side door/driveway entrance   Goals:  Goals Addressed             This Visit's Progress    Patient Stated       She would like to feel more safe in her bathroom(s). Master bath this could be  accomplished by adding grab bars at/in her walk in shower, adding in a lower handheld shower holder, and getting her a shower seat. Also in this bathroom she would benefit from a higher toilet.      Patient Stated       She would like to feel safer getting in and out of the house. Front door: railing needs to be secured, needs an extra rail at from sidewalk to step down onto driveway, some bricks need cement between them to secure them. Side door at driveway: needs a new screen and main door. Back door to back yard: needs a rail and cement between some of the bricks, is there a way to make sliding glass door and screen slide easier?        Post Clinical Reasoning: Clinician View Of Client Situation:: It was a pleasure meeting Suzanne Neal. She stays active throughout the day and is in the middle of repainting her house. She does have left knee pain and she tries to not let this slow her down. She able to manage for herself but due to her knee pain it does make some ADLs more difficult ([id number] up and down from the toilet and going up and down steps, as well as balance when showering. Client View Of His/Her Situation:: Suzanne Neal is glad that she continues to be active and she stays in tune with her health. Next Visit Plan:: Education on getting up from a fall and safety in the house  [place], [place] Aging Gracefully [phone]

## 2024-04-15 ENCOUNTER — Other Ambulatory Visit: Payer: Self-pay | Admitting: *Deleted

## 2024-04-15 NOTE — Patient Outreach (Signed)
 Aging Gracefully Program  04/15/2024  Suzanne Neal 05-Apr-1946 [id number]    Telephone call made to [name] to schedule initial Aging Gracefully RN home visit. However, no answer. HIPAA compliant voicemail message.    Pablo Hurst, MSN, RN, BSN [place]  [facility], Healthy Communities RN Case Manager for Aging Gracefully Direct Dial: [phone]

## 2024-04-16 ENCOUNTER — Other Ambulatory Visit: Payer: Self-pay | Admitting: *Deleted

## 2024-04-16 ENCOUNTER — Encounter: Payer: Self-pay | Admitting: *Deleted

## 2024-04-16 NOTE — Patient Instructions (Addendum)
 Visit Information  Thank you for taking time to visit with me today. Please don't hesitate to contact me if I can be of assistance to you before our next scheduled home appointment.  Following are the goals we discussed today:   Goals Addressed               This Visit's Progress     AG RN (pt-stated)        04/16/24  Assessment: Suzanne Neal reports pain 0 out of 10. Has chronic left knee pain. Reports fall last month from wearing the wrong type of shoes. Reports having difficult time getting up from couch. States lower extremities have gotten weaker. 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. However, her current insurance plan does not cover Silver Sneakers or OTC benefit. States she has changed back to her old insurance plan to encompass OTC benefit and Silver Sneakers. Recently ordered a leg exercise device. Reports going to [employer] weekly.  Interventions: Provided chronic conditions booklet along with contact information. Encouraged [name] to watch exercise videos for seniors. Assigned EMMI videos, via [name]'s email, for fall safety, chair exercises, and leg strengthening exercises. Discussed fall precautions. Encouraged [name] to take slower movements when changing positions and when walking around. Encouraged [name] to have cane nearby, for use, when getting up from the couch. Provided pack of water bottles and bag of food, as courtesy of [employer] [facility].   Plan: Scheduled next home visit on Dec. 18th at 1230 pm.    CLIENT/RN ACTION PLAN - FALL PREVENTION  Registered Nurse:  Pablo Hurst Date: 04-16-24  Client Name: Suzanne Neal Client ID:    Target Area:  FALL PREVENTION    Why Problem May Occur: Wearing incorrect shoes; moving too quickly   Target Goal: No falls over the next 160 days   STRATEGIES Coping Strategies Ideas  Change Positions Slowly: lying to sitting, sitting to standing Reviewed 04/16/24 Changing  positions can make people lightheaded. When getting up in the morning sit for a few minutes, before standing.  Stand for a few minutes before walking and hold on to sturdy furniture or countertop.    Drink water Reviewed 04/16/24 Dehydration can make people dizzy. Ask your healthcare provider how much water you can drink. Decrease caffeine, caffeine makes you urinate a lot, which can make you dehydrated.   If you fall, tell someone Reviewed 04/16/24 Tell a friend or family member even if you didn't get hurt. Tell your healthcare provider if you fall.  They can help you figure out why.   Get your vision and hearing checked Reviewed 04/16/24 Poor vision and hearing loss can make people fall. Glaucoma and diabetes can cause poor vision.   Other    Prevention Ideas  Review your Medicines Reviewed 04/16/24 Many medicines can make you dizzy or sleepy and increase your risk of falling. Your Aging Gracefully Nurse will look at your medications and see if you are taking any that might cause falling.   Activity and Exercise Reviewed 04/16/24 Aging Gracefully exercises Walking (inside or outside) [employer]:  cooking, cleaning, laundry Exercise while watching TV Swimming or water aerobics.   Strengthen Bones Reviewed 04/16/24 Exercise makes your bones stronger. Vitamin D and Calcium make your bones stronger.  Ask your Healthcare provider if you should be taking them. Your body makes vitamin D from the sun.  Sit in the sun for 10 minutes every  day (without sunscreen).   Control your urine  Reviewed 04/16/24 Prevent constipation Cut back on caffeinated drinks. Don't wait to urinate. If diabetic, control your blood sugar. Ask your Aging Gracefully Nurse and Healthcare Provider  about urine control.   Control your blood sugar (if you are diabetic) High blood sugar can cause frequent urination, poor vision, and numbness in your feet, which can make you fall.   Low blood  sugar can cause confusion and dizziness.   Other coping strategies 1. 2. 3. 4. 5.   PRACTICE It is important to practice the strategies so we can determine if they will be effective in helping to reach the goal.    Follow these specific recommendations:        If strategy does not work the first time, try it again.     We may make some changes over the next few sessions.       Pablo Hurst, MSN, RN, BSN [place]  [facility], Healthy Communities RN Case Manager for Aging Gracefully Direct Dial: [phone]                                      Our next appointment is on Dec. 18th at 1230 pm.  If you are experiencing a Mental Health or Behavioral Health Crisis or need someone to talk to, please call the Suicide and Crisis Lifeline: 988 call the [place]  National Suicide Prevention Lifeline: 364-060-2139 or TTY: (581)550-9556 TTY 978-084-7604) to talk to a trained counselor call 1-800-273-TALK (toll free, 24 hour hotline) go to [facility] [address] [place] [phone]) call 911   The patient verbalized understanding of instructions, educational materials, and care plan provided today and agreed to receive a mailed copy of patient instructions, educational materials, and care plan.   Pablo Hurst, MSN, RN, BSN   [facility], Healthy Communities RN Case Manager for Aging Gracefully Direct Dial: [phone]

## 2024-04-16 NOTE — Patient Outreach (Signed)
 Aging Gracefully Program  RN Visit  04/16/2024  Suzanne Neal Jul 25, 1945 [id number]  Visit:  RN Visit Number: 1- Initial Visit  RN TIME CALCULATION: Start TIme:  RN Start Time Calculation: 1230 End Time:  RN Stop Time Calculation: 1430 Total Minutes:  RN Time Calculation: 120  Readiness To Change Score:   10  Universal RN Interventions: Calendar Distribution: Yes Exercise Review: No Medications: Yes Medication Changes: No Mood: Yes Pain: Yes PCP Advocacy/Support: No Fall Prevention: Yes Incontinence: Yes Clinician View Of Client Situation: Arrived for home visit. Suzanne Neal answered door with pleasant and welcoming demeanor. Visible signs of painting being done throughout the house. Met with Suzanne Neal in the kitchen. Several scattered area rugs noted. Suzanne Neal ambulating independently without assitive device. Client View Of His/Her Situation: Suzanne Neal rates pain 0 out of 10. Reports fall last month. Suzanne Neal reports looking forward to home modifications with [facility].  Healthcare Provider Communication: Did Surveyor, [employer] Provider?: No Healthcare Provider Response According to RN: N/A According to Client, Did PCP Report Communication With An Aging Gracefully RN?: No Healthcare Provider Response According To Client: N/A  Clinician View of Client Situation: Clinician View Of Client Situation: Arrived for home visit. Suzanne Neal answered door with pleasant and welcoming demeanor. Visible signs of painting being done throughout the house. Met with Suzanne Neal in the kitchen. Several scattered area rugs noted. Suzanne Neal ambulating independently without assitive device. Client's View of His/Her Situation: Client View Of His/Her Situation: Suzanne Neal rates pain 0 out of 10. Reports fall last month. Suzanne Neal reports looking forward to home modifications with [facility].  Medication Assessment: Do You Have Any Problems Paying For Medications?: No Can Client Read Pill Bottles?: Yes Does  Client Use A Pillbox?: No Does Anyone Assist Client In Taking Medications?: No Do You Take Vitamin D?: Yes Does Client Have Any Questions Or Concerns About Medictions?: No Is Client Complaining Of Any Symptoms That Could Be Side Effects To Medications?: No Any Possible Changes In Medication Regimen?: No  OT Update: Pending [facility] contracts and assessments.  Session Summary: Suzanne Neal is engaging and delightful to meet. She is does not take medications other than supplements. Takes a holistic approach to health and wellness.    Goals Addressed               This Visit's Progress     AG RN (pt-stated)        04/16/24  Assessment: Suzanne Neal reports pain 0 out of 10. Has chronic left knee pain. Reports fall last month from wearing the wrong type of shoes. Reports having difficult time getting up from couch. States lower extremities have gotten weaker. 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. However, her current insurance plan does not cover Silver Sneakers or OTC benefit. States she has changed back to her old insurance plan to encompass OTC benefit and Silver Sneakers. Recently ordered a leg exercise device. Reports going to [employer] weekly.  Interventions: Provided chronic conditions booklet along with contact information. Encouraged Suzanne Neal to watch exercise videos for seniors. Assigned EMMI videos, via Suzanne Neal email, for fall safety, chair exercises, and leg strengthening exercises. Discussed fall precautions. Encouraged Suzanne Neal to take slower movements when changing positions and when walking around. Encouraged Suzanne Neal to have cane nearby, for use, when getting up from the couch. Provided pack of water bottles and bag of food, as courtesy of [employer]  [name].   Plan: Scheduled next home visit on Dec. 18th at 1230 pm.    CLIENT/RN ACTION PLAN - FALL PREVENTION  Registered Nurse:  Suzanne Neal Date: 04-16-24  Client Name: Suzanne Neal Client ID:    Target Area:  FALL PREVENTION    Why Problem May Occur: Wearing incorrect shoes; moving too quickly   Target Goal: No falls over the next 160 days   STRATEGIES Coping Strategies Ideas  Change Positions Slowly: lying to sitting, sitting to standing Reviewed 04/16/24 Changing positions can make people lightheaded. When getting up in the morning sit for a few minutes, before standing.  Stand for a few minutes before walking and hold on to sturdy furniture or countertop.    Drink water Reviewed 04/16/24 Dehydration can make people dizzy. Ask your healthcare provider how much water you can drink. Decrease caffeine, caffeine makes you urinate a lot, which can make you dehydrated.   If you fall, tell someone Reviewed 04/16/24 Tell a friend or family member even if you didn't get hurt. Tell your healthcare provider if you fall.  They can help you figure out why.   Get your vision and hearing checked Reviewed 04/16/24 Poor vision and hearing loss can make people fall. Glaucoma and diabetes can cause poor vision.   Other    Prevention Ideas  Review your Medicines Reviewed 04/16/24 Many medicines can make you dizzy or sleepy and increase your risk of falling. Your Aging Gracefully Nurse will look at your medications and see if you are taking any that might cause falling.   Activity and Exercise Reviewed 04/16/24 Aging Gracefully exercises Walking (inside or outside) [employer]:  cooking, cleaning, laundry Exercise while watching TV Swimming or water aerobics.   Strengthen Bones Reviewed 04/16/24 Exercise makes your bones stronger. Vitamin D and Calcium make your bones stronger.  Ask your Healthcare provider if you should be taking them. Your body makes vitamin D from the sun.  Sit in the sun for 10 minutes every day (without sunscreen).   Control your urine  Reviewed 04/16/24 Prevent constipation Cut back on caffeinated drinks. Don't wait  to urinate. If diabetic, control your blood sugar. Ask your Aging Gracefully Nurse and Healthcare Provider  about urine control.   Control your blood sugar (if you are diabetic) High blood sugar can cause frequent urination, poor vision, and numbness in your feet, which can make you fall.   Low blood sugar can cause confusion and dizziness.   Other coping strategies 1. 2. 3. 4. 5.   PRACTICE It is important to practice the strategies so we can determine if they will be effective in helping to reach the goal.    Follow these specific recommendations:        If strategy does not work the first time, try it again.     We may make some changes over the next few sessions.       Suzanne Hurst, MSN, RN, BSN [place]  [facility], Healthy Communities RN Case Manager for Aging Gracefully Direct Dial: [phone]

## 2024-05-20 ENCOUNTER — Other Ambulatory Visit: Admitting: Occupational Therapy

## 2024-05-20 NOTE — Patient Outreach (Signed)
 Aging Gracefully Program  OT Follow-Up Visit  05/20/2024  Suzanne Neal 1946/02/16 [id number]  Visit:  2- Second Visit  Start Time:  1625 End Time:  1700 Total Minutes:  35  Readiness to Change Score :  Readiness to Change Score: 10  Patient Education: Education Provided: Yes Education Details: How to get up from a fall and home safety checklist Person(s) Educated: Patient Comprehension: Verbalized Understanding  Post Clinical Reasoning: Clinician View Of Client Situation:: Suzanne Neal is doing well. Outside working in yard today and had girlfriend in town for the weekend. She and friends have gotten her livingroom painted and it looks great. She was open and receptive to the education gone over with her today. Client View Of His/Her Situation:: Feels she is doing well, left knee still gives her issues when she sits too long but when she gets up and gets moving she does well. She is looking forward to the work being done on her house. Next Visit Plan:: shower seat for shower  .cathy

## 2024-05-22 ENCOUNTER — Other Ambulatory Visit: Payer: Self-pay | Admitting: *Deleted

## 2024-05-22 NOTE — Patient Outreach (Signed)
 Aging Gracefully Program  RN Visit  05/22/2024  Suzanne Neal 1945/09/01 [id number]  Visit:  RN Visit Number: 2- Second Visit  RN TIME CALCULATION: Start TIme:  RN Start Time Calculation: 1230 End Time:  RN Stop Time Calculation: 1315 Total Minutes:  RN Time Calculation: 45  Readiness To Change Score:  Readiness to Change Score: 10  Universal RN Interventions: Calendar Distribution: No Exercise Review: Yes Medications: Yes Medication Changes: No Mood: Yes Pain: Yes PCP Advocacy/Support: No Fall Prevention: Yes Incontinence: Yes Clinician View Of Client Situation: Arrived for home visit. Suzanne Neal was sitting on the front porch initially upon writer's arrival. Suzanne Neal ambulates independently without assistive device. Still drives. Kitchen area neat where visit took place. Client View Of His/Her Situation: Suzanne Neal denies pain or falls. Looking forward to [facility] home modifications. Reports she has a leaking pipe.  Healthcare Provider Communication: Did Surveyor, [employer] Provider?: No Healthcare Provider Response According to RN: n/a According to Client, Did PCP Report Communication With An Aging Gracefully RN?: No Healthcare Provider Response According To Client: n/a  Clinician View of Client Situation: Clinician View Of Client Situation: Arrived for home visit. [name] was sitting on the front porch initially upon writer's arrival. [name] ambulates independently without assistive device. Still drives. Kitchen area neat where visit took place. Client's View of His/Her Situation: Client View Of His/Her Situation: Suzanne Neal denies pain or falls. Looking forward to [facility] home modifications. Reports she has a leaking pipe.  Medication Assessment: Reviewed    OT Update: Pending [facility] contracts and assessments   Session Summary: Suzanne Neal is doing well overall. She has several home repairs that she is looking forward to be being completed soon. She is active and  engaging during home visit.    Goals Addressed               This Visit's Progress     AG RN (pt-stated)        04/16/24  Assessment: Suzanne Neal reports pain 0 out of 10. Has chronic left knee pain. Reports fall last month from wearing the wrong type of shoes. Reports having difficult time getting up from couch. States lower extremities have gotten weaker. 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. However, her current insurance plan does not cover Silver Sneakers or OTC benefit. States she has changed back to her old insurance plan to encompass OTC benefit and Silver Sneakers. Recently ordered a leg exercise device. Reports going to [employer] weekly.  Interventions: Provided chronic conditions booklet along with contact information. Encouraged Suzanne Neal to watch exercise videos for seniors. Assigned EMMI videos, via [name]'s email, for fall safety, chair exercises, and leg strengthening exercises. Discussed fall precautions. Encouraged Suzanne Neal to take slower movements when changing positions and when walking around. Encouraged Suzanne Neal to have cane nearby, for use, when getting up from the couch. Provided pack of water bottles and bag of food, as courtesy of [employer] [facility].   Plan: Scheduled next home visit on Dec. 18th at 1230 pm.    CLIENT/RN ACTION PLAN - FALL PREVENTION  Registered Nurse:  Pablo Hurst Date: 04-16-24  Client Name: Suzanne Neal Client ID:    Target Area:  FALL PREVENTION    Why Problem May Occur: Wearing incorrect shoes; moving too quickly   Target Goal: No falls over the next 160 days   STRATEGIES Coping Strategies Ideas  Change Positions  Slowly: lying to sitting, sitting to standing Reviewed 04/16/24 Reviewed 05/22/24 Changing positions can make people lightheaded. When getting up in the morning sit for a few minutes, before standing.  Stand for a few minutes before walking and hold on to sturdy furniture  or countertop.    Drink water Reviewed 04/16/24 Reviewed 05/22/24 Dehydration can make people dizzy. Ask your healthcare provider how much water you can drink. Decrease caffeine, caffeine makes you urinate a lot, which can make you dehydrated.   If you fall, tell someone Reviewed 04/16/24 Reviewed 05/22/24 Tell a friend or family member even if you didn't get hurt. Tell your healthcare provider if you fall.  They can help you figure out why.   Get your vision and hearing checked Reviewed 04/16/24 Reviewed 05/22/24 Poor vision and hearing loss can make people fall. Glaucoma and diabetes can cause poor vision.   Other    Prevention Ideas  Review your Medicines Reviewed 04/16/24 Reviewed 05/22/24 Many medicines can make you dizzy or sleepy and increase your risk of falling. Your Aging Gracefully Nurse will look at your medications and see if you are taking any that might cause falling.   Activity and Exercise Reviewed 04/16/24 Reviewed 05/22/24 Aging Gracefully exercises Walking (inside or outside) [employer]:  cooking, education officer, environmental, laundry Exercise while watching TV Swimming or water aerobics.   Strengthen Bones Reviewed 04/16/24 Reviewed 05/22/24 Exercise makes your bones stronger. Vitamin D and Calcium make your bones stronger.  Ask your Healthcare provider if you should be taking them. Your body makes vitamin D from the sun.  Sit in the sun for 10 minutes every day (without sunscreen).   Control your urine  Reviewed 04/16/24 Reviewed 05/22/24 Prevent constipation Cut back on caffeinated drinks. Don't wait to urinate. If diabetic, control your blood sugar. Ask your Aging Gracefully Nurse and Healthcare Provider  about urine control.   Control your blood sugar (if you are diabetic) High blood sugar can cause frequent urination, poor vision, and numbness in your feet, which can make you fall.   Low blood sugar can cause confusion and dizziness.   Other  coping strategies 1. 2. 3. 4. 5.   PRACTICE It is important to practice the strategies so we can determine if they will be effective in helping to reach the goal.    Follow these specific recommendations:        If strategy does not work the first time, try it again.     We may make some changes over the next few sessions.       Pablo Hurst, MSN, RN, BSN [facility], Healthy Communities RN Case Manager for Aging Gracefully Direct Dial: [phone]       05/22/24  Assessment: Ms. Marlette is doing well overall. Denies pain or falls. Stays active and busy around the house. Reports doing exercises regularly. Especially in the mornings. Ms. Walters endorses using Voltaren gel PRN and heating pad when she does have pain. Looking forward to using her Silver Sneakers membership at the [employer].   Interventions: Demonstrated Aging Gracefully home exercises and provided booklet. Ms. Ferner followed along in booklet. Encouraged Ms. Toste to perform exercises throughout the day. Discussed fall precautions.  Plan: Scheduled next home visit for January 20th at 1230 pm.    Pablo Hurst, MSN, RN, BSN [place]  [facility], Healthy Communities RN Case Manager for Aging Gracefully Direct Dial: [phone]  Pablo Hurst, MSN, RN, BSN [place]  [facility], Healthy Communities RN Case Manager for Aging Gracefully Direct Dial: [phone]

## 2024-05-22 NOTE — Patient Instructions (Signed)
 Visit Information  Thank you for taking time to visit with me today. Please don't hesitate to contact me if I can be of assistance to you before our next scheduled home appointment.  Following are the goals we discussed today:   Goals Addressed               This Visit's Progress     AG RN (pt-stated)        04/16/24  Assessment: Ms. Bauer reports pain 0 out of 10. Has chronic left knee pain. Reports fall last month from wearing the wrong type of shoes. Reports having difficult time getting up from couch. States lower extremities have gotten weaker. 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. However, her current insurance plan does not cover Silver Sneakers or OTC benefit. States she has changed back to her old insurance plan to encompass OTC benefit and Silver Sneakers. Recently ordered a leg exercise device. Reports going to [employer] weekly.  Interventions: Provided chronic conditions booklet along with contact information. Encouraged Ms. Fujikawa to watch exercise videos for seniors. Assigned EMMI videos, via Ms. Pipkins's email, for fall safety, chair exercises, and leg strengthening exercises. Discussed fall precautions. Encouraged Ms. Vipond to take slower movements when changing positions and when walking around. Encouraged Ms. Dallman to have cane nearby, for use, when getting up from the couch. Provided pack of water bottles and bag of food, as courtesy of [employer] [facility].   Plan: Scheduled next home visit on Dec. 18th at 1230 pm.    CLIENT/RN ACTION PLAN - FALL PREVENTION  Registered Nurse:  Pablo Hurst Date: 04-16-24  Client Name: Riley Sheria Client ID:    Target Area:  FALL PREVENTION    Why Problem May Occur: Wearing incorrect shoes; moving too quickly   Target Goal: No falls over the next 160 days   STRATEGIES Coping Strategies Ideas  Change Positions Slowly: lying to sitting, sitting to standing Reviewed 04/16/24 Reviewed  05/22/24 Changing positions can make people lightheaded. When getting up in the morning sit for a few minutes, before standing.  Stand for a few minutes before walking and hold on to sturdy furniture or countertop.    Drink water Reviewed 04/16/24 Reviewed 05/22/24 Dehydration can make people dizzy. Ask your healthcare provider how much water you can drink. Decrease caffeine, caffeine makes you urinate a lot, which can make you dehydrated.   If you fall, tell someone Reviewed 04/16/24 Reviewed 05/22/24 Tell a friend or family member even if you didn't get hurt. Tell your healthcare provider if you fall.  They can help you figure out why.   Get your vision and hearing checked Reviewed 04/16/24 Reviewed 05/22/24 Poor vision and hearing loss can make people fall. Glaucoma and diabetes can cause poor vision.   Other    Prevention Ideas  Review your Medicines Reviewed 04/16/24 Reviewed 05/22/24 Many medicines can make you dizzy or sleepy and increase your risk of falling. Your Aging Gracefully Nurse will look at your medications and see if you are taking any that might cause falling.   Activity and Exercise Reviewed 04/16/24 Reviewed 05/22/24 Aging Gracefully exercises Walking (inside or outside) [employer]:  cooking, education officer, environmental, laundry Exercise while watching TV Swimming or water aerobics.   Strengthen Bones Reviewed 04/16/24 Reviewed 05/22/24 Exercise makes your bones stronger. Vitamin D and Calcium make your bones stronger.  Ask your Healthcare provider if you should be taking them. Your body makes  vitamin D from the sun.  Sit in the sun for 10 minutes every day (without sunscreen).   Control your urine  Reviewed 04/16/24 Reviewed 05/22/24 Prevent constipation Cut back on caffeinated drinks. Don't wait to urinate. If diabetic, control your blood sugar. Ask your Aging Gracefully Nurse and Healthcare Provider  about urine control.   Control your blood  sugar (if you are diabetic) High blood sugar can cause frequent urination, poor vision, and numbness in your feet, which can make you fall.   Low blood sugar can cause confusion and dizziness.   Other coping strategies 1. 2. 3. 4. 5.   PRACTICE It is important to practice the strategies so we can determine if they will be effective in helping to reach the goal.    Follow these specific recommendations:        If strategy does not work the first time, try it again.     We may make some changes over the next few sessions.       Pablo Hurst, MSN, RN, BSN [facility], Healthy Communities RN Case Manager for Aging Gracefully Direct Dial: [phone]       05/22/24  Assessment: Ms. Welliver is doing well overall. Denies pain or falls. Stays active and busy around the house. Reports doing exercises regularly. Especially in the mornings. Ms. Bierly endorses using Voltaren gel PRN and heating pad when she does have pain. Looking forward to using her Silver Sneakers membership at the [employer].   Interventions: Demonstrated Aging Gracefully home exercises and provided booklet. Ms. Misenheimer followed along in booklet. Encouraged Ms. Madewell to perform exercises throughout the day. Discussed fall precautions.  Plan: Scheduled next home visit for January 20th at 1230 pm.    Pablo Hurst, MSN, RN, BSN [place]  [facility], Healthy Communities RN Case Manager for Aging Gracefully Direct Dial: [phone]                                                                          Our next appointment is on Jan. 20, 2026 at 1230 pm.  If you are experiencing a Mental Health or Behavioral Health Crisis or need someone to talk to, please call the Suicide and Crisis Lifeline: 988 call the [place]  National Suicide Prevention Lifeline: 579 552 7735 or TTY: 912-780-6629 TTY (707)757-3724) to talk to a  trained counselor call 1-800-273-TALK (toll free, 24 hour hotline) go to [facility] [address] [place] [phone]) call 911   The patient verbalized understanding of instructions, educational materials, and care plan provided today and agreed to receive a mailed copy of patient instructions, educational materials, and care plan.   Pablo Hurst, MSN, RN, BSN [place]  [facility], Healthy Communities RN Case Manager for Aging Gracefully Direct Dial: [phone]

## 2024-06-24 ENCOUNTER — Other Ambulatory Visit: Payer: Self-pay | Admitting: *Deleted

## 2024-06-24 NOTE — Patient Instructions (Signed)
 Visit Information  Thank you for taking time to visit with me today.   Following are the goals we discussed today:   Goals Addressed               This Visit's Progress     AG RN (pt-stated)        04/16/24  Assessment: Ms. Hilbun reports pain 0 out of 10. Has chronic left knee pain. Reports fall last month from wearing the wrong type of shoes. Reports having difficult time getting up from couch. States lower extremities have gotten weaker. 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. However, her current insurance plan does not cover Silver Sneakers or OTC benefit. States she has changed back to her old insurance plan to encompass OTC benefit and Silver Sneakers. Recently ordered a leg exercise device. Reports going to [employer] weekly.  Interventions: Provided chronic conditions booklet along with contact information. Encouraged Ms. Pluta to watch exercise videos for seniors. Assigned EMMI videos, via Ms. Karnik's email, for fall safety, chair exercises, and leg strengthening exercises. Discussed fall precautions. Encouraged Ms. Hodsdon to take slower movements when changing positions and when walking around. Encouraged Ms. Gautreau to have cane nearby, for use, when getting up from the couch. Provided pack of water bottles and bag of food, as courtesy of [employer] [facility].   Plan: Scheduled next home visit on Dec. 18th at 1230 pm.    CLIENT/RN ACTION PLAN - FALL PREVENTION  Registered Nurse:  Pablo Hurst Date: 04-16-24  Client Name: Riley Sheria Client ID:    Target Area:  FALL PREVENTION    Why Problem May Occur: Wearing incorrect shoes; moving too quickly   Target Goal: No falls over the next 120 days   STRATEGIES Coping Strategies Ideas  Change Positions Slowly: lying to sitting, sitting to standing Reviewed 04/16/24 Reviewed 05/22/24 Reviewed 06/24/24 Changing positions can make people lightheaded. When getting up in the morning sit  for a few minutes, before standing.  Stand for a few minutes before walking and hold on to sturdy furniture or countertop.    Drink water Reviewed 04/16/24 Reviewed 05/22/24 Reviewed 06/24/24 Dehydration can make people dizzy. Ask your healthcare provider how much water you can drink. Decrease caffeine, caffeine makes you urinate a lot, which can make you dehydrated.   If you fall, tell someone Reviewed 04/16/24 Reviewed 05/22/24 Reviewed 06/24/24 Tell a friend or family member even if you didn't get hurt. Tell your healthcare provider if you fall.  They can help you figure out why.   Get your vision and hearing checked Reviewed 04/16/24 Reviewed 05/22/24 Reviewed 06/24/24 Poor vision and hearing loss can make people fall. Glaucoma and diabetes can cause poor vision.   Other    Prevention Ideas  Review your Medicines Reviewed 04/16/24 Reviewed 05/22/24 Reviewed 06/24/24 Many medicines can make you dizzy or sleepy and increase your risk of falling. Your Aging Gracefully Nurse will look at your medications and see if you are taking any that might cause falling.   Activity and Exercise Reviewed 04/16/24 Reviewed 05/22/24 Reviewed 06/24/24 Aging Gracefully exercises Walking (inside or outside) [employer]:  cooking, education officer, environmental, laundry Exercise while watching TV Swimming or water aerobics.   Strengthen Bones Reviewed 04/16/24 Reviewed 05/22/24 Reviewed 06/24/24 Exercise makes your bones stronger. Vitamin D and Calcium make your bones stronger.  Ask your Healthcare provider if you should be taking them. Your body makes vitamin D from the sun.  Sit in the sun for 10 minutes every day (without sunscreen).   Control your urine  Reviewed 04/16/24 Reviewed 05/22/24 Reviewed 06/24/24 Prevent constipation Cut back on caffeinated drinks. Don't wait to urinate. If diabetic, control your blood sugar. Ask your Aging Gracefully Nurse and Healthcare Provider  about urine  control.   Control your blood sugar (if you are diabetic) High blood sugar can cause frequent urination, poor vision, and numbness in your feet, which can make you fall.   Low blood sugar can cause confusion and dizziness.   Other coping strategies 1. 2. 3. 4. 5.   PRACTICE It is important to practice the strategies so we can determine if they will be effective in helping to reach the goal.    Follow these specific recommendations:        If strategy does not work the first time, try it again.     We may make some changes over the next few sessions.       Pablo Hurst, MSN, RN, BSN [facility], Healthy Communities RN Case Manager for Aging Gracefully Direct Dial: [phone]       05/22/24  Assessment: Ms. Boord is doing well overall. Denies pain or falls. Stays active and busy around the house. Reports doing exercises regularly. Especially in the mornings. Ms. Menta endorses using Voltaren gel PRN and heating pad when she does have pain. Looking forward to using her Silver Sneakers membership at the [employer].   Interventions: Demonstrated Aging Gracefully home exercises and provided booklet. Ms. Frizell followed along in booklet. Encouraged Ms. Grandberry to perform exercises throughout the day. Discussed fall precautions.  Plan: Scheduled next home visit for January 20th at 1230 pm.    Pablo Hurst, MSN, RN, BSN [place]  [facility], Healthy Communities RN Case Manager for Aging Gracefully Direct Dial: [phone]     06/24/24 AG RN visit #3  Assessment: Ms. Kurdziel rates knee pain 7 out of 10. Denies any falls. Reports going to [facility] as benefit of her Silver Sneakers membership thru insurance one to two times a week. States the heated pool helps her knee pain tremendously. She is looking forward to going to the [facility] today after AG RN visit.  Reports changing her PCP to [facility] provider Candis, NP. States she is pleased with  her new provider. Reports having recent PCP appointment. States she also received a flu shot. Ms. Fulfer endorses also performing home exercises. Ms. Knoche endorses using cane as needed.   Interventions: Discussed fall precautions. Complimented Ms. Schear for her going to the [facility] and exercising consistently. Encouraged Ms. Tiznado to stay hydrated. Encouraged Ms. Bowell to utilize rug grippers on area rugs if she does not take them up. Discussed area rugs are a trip hazard.   Plan: Scheduled final AG RN home visit for February 19th at 1230 pm.   Pablo Hurst, MSN, RN, BSN [place]  [facility], Healthy Communities RN Case Manager for Aging Gracefully Direct Dial: [phone]  Our next appointment is on February 19th at 1230pm.  If you are experiencing a Mental Health or Behavioral Health Crisis or need someone to talk to, please call the Suicide and Crisis Lifeline: 988 call the [place]  National Suicide Prevention Lifeline: 813-557-7737 or TTY: 443-548-4623 TTY 601-134-3864) to talk to a trained counselor call 1-800-273-TALK (toll free, 24 hour hotline) go to [facility] [address] [place] [phone]) call 911   The patient verbalized understanding of instructions, educational materials, and care plan provided today and agreed to receive a mailed copy of patient instructions, educational materials, and care plan.   Pablo Hurst, MSN, RN, BSN [place]  [facility], Healthy Communities RN Case Manager for Aging Gracefully Direct Dial: [phone]

## 2024-06-24 NOTE — Patient Outreach (Signed)
 Aging Gracefully Program  RN Visit  06/24/2024  Suzanne Neal Oct 31, 1945 [id number]  Visit:  RN Visit Number: 3- Third Visit  RN TIME CALCULATION: Start TIme:  RN Start Time Calculation: 1230 End Time:  RN Stop Time Calculation: 1315 Total Minutes:  RN Time Calculation: 45  Readiness To Change Score:  Readiness to Change Score: 10  Universal RN Interventions: Calendar Distribution: No Exercise Review: Yes Medications: Yes Medication Changes: No Mood: Yes Pain: Yes PCP Advocacy/Support: No Fall Prevention: Yes Incontinence: Yes Clinician View Of Suzanne Situation: Arrived for home visit. Suzanne Neal answered door.Ambulating independently without assistive device. Home neat. Area rugs noted in hallway. Suzanne Neal her usual cheerful energetic self. Suzanne View Of His/Her Situation: Suzanne Neal reports going to the [employer] 1-2 times a week. She is very pleased that her new insurance covers [employer]. States getting in the hot tub helps her knees. Reports pain 7 out of 10 today. Denies any falls.  Healthcare Provider Communication: Did Surveyor, Mining With [employer] Provider?: No Healthcare Provider Response According to RN: n/a According to Suzanne, Did PCP Report Communication With An Aging Gracefully RN?: No Healthcare Provider Response According To Suzanne: n/a  Clinician View of Suzanne Situation: Clinician View Of Suzanne Situation: Arrived for home visit. Suzanne Neal answered door.Ambulating independently without assistive device. Home neat. Area rugs noted in hallway. Suzanne Neal her usual cheerful energetic self. Suzanne View of His/Her Situation: Suzanne View Of His/Her Situation: [name] reports going to the [employer] 1-2 times a week. She is very pleased that her new insurance covers [employer]. States getting in the hot tub helps her knees. Reports pain 7 out of 10 today. Denies any falls.  Medication Assessment: reviewed    OT Update: Pending [facility] assessments  and contracts  Session Summary: Suzanne Neal is delightful and very engaging during home visit. She reports consistency with home exercises and going to the [facility]. She reports commitment to her health and wellness journey.    Goals Addressed               This Visit's Progress     AG RN (pt-stated)        04/16/24  Assessment: Suzanne Neal reports pain 0 out of 10. Has chronic left knee pain. Reports fall last month from wearing the wrong type of shoes. Reports having difficult time getting up from couch. States lower extremities have gotten weaker. 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. However, her current insurance plan does not cover Silver Sneakers or OTC benefit. States she has changed back to her old insurance plan to encompass OTC benefit and Silver Sneakers. Recently ordered a leg exercise device. Reports going to [employer] weekly.  Interventions: Provided chronic conditions booklet along with contact information. Encouraged Suzanne Neal to watch exercise videos for seniors. Assigned EMMI videos, via Suzanne Neal's email, for fall safety, chair exercises, and leg strengthening exercises. Discussed fall precautions. Encouraged Suzanne Neal to take slower movements when changing positions and when walking around. Encouraged Suzanne Neal to have cane nearby, for use, when getting up from the couch. Provided pack of water bottles and bag of food, as courtesy of [employer] [facility].   Plan: Scheduled next home visit on Dec. 18th at 1230 pm.    Suzanne ACTION PLAN - FALL PREVENTION  Registered Nurse:  Pablo Hurst Date: 04-16-24  Suzanne Neal Suzanne ID:    Target Area:  FALL PREVENTION    Why Problem May Occur: Wearing incorrect shoes; moving too quickly   Target Goal: No falls over the next 120 days   STRATEGIES Coping Strategies Ideas  Change Positions Slowly: lying to sitting, sitting to standing Reviewed 04/16/24 Reviewed  05/22/24 Reviewed 06/24/24 Changing positions can make people lightheaded. When getting up in the morning sit for a few minutes, before standing.  Stand for a few minutes before walking and hold on to sturdy furniture or countertop.    Drink water Reviewed 04/16/24 Reviewed 05/22/24 Reviewed 06/24/24 Dehydration can make people dizzy. Ask your healthcare provider how much water you can drink. Decrease caffeine, caffeine makes you urinate a lot, which can make you dehydrated.   If you fall, tell someone Reviewed 04/16/24 Reviewed 05/22/24 Reviewed 06/24/24 Tell a friend or family member even if you didn't get hurt. Tell your healthcare provider if you fall.  They can help you figure out why.   Get your vision and hearing checked Reviewed 04/16/24 Reviewed 05/22/24 Reviewed 06/24/24 Poor vision and hearing loss can make people fall. Glaucoma and diabetes can cause poor vision.   Other    Prevention Ideas  Review your Medicines Reviewed 04/16/24 Reviewed 05/22/24 Reviewed 06/24/24 Many medicines can make you dizzy or sleepy and increase your risk of falling. Your Aging Gracefully Nurse will look at your medications and see if you are taking any that might cause falling.   Activity and Exercise Reviewed 04/16/24 Reviewed 05/22/24 Reviewed 06/24/24 Aging Gracefully exercises Walking (inside or outside) [employer]:  cooking, education officer, environmental, laundry Exercise while watching TV Swimming or water aerobics.   Strengthen Bones Reviewed 04/16/24 Reviewed 05/22/24 Reviewed 06/24/24 Exercise makes your bones stronger. Vitamin D and Calcium make your bones stronger.  Ask your Healthcare provider if you should be taking them. Your body makes vitamin D from the sun.  Sit in the sun for 10 minutes every day (without sunscreen).   Control your urine  Reviewed 04/16/24 Reviewed 05/22/24 Reviewed 06/24/24 Prevent constipation Cut back on caffeinated drinks. Don't wait to  urinate. If diabetic, control your blood sugar. Ask your Aging Gracefully Nurse and Healthcare Provider  about urine control.   Control your blood sugar (if you are diabetic) High blood sugar can cause frequent urination, poor vision, and numbness in your feet, which can make you fall.   Low blood sugar can cause confusion and dizziness.   Other coping strategies 1. 2. 3. 4. 5.   PRACTICE It is important to practice the strategies so we can determine if they will be effective in helping to reach the goal.    Follow these specific recommendations:        If strategy does not work the first time, try it again.     We may make some changes over the next few sessions.       Pablo Hurst, MSN, RN, BSN [facility], [facility] Commun[facility] RN Case Manager for Aging Gracefully Direct Dial: [phone]       05/22/24  Assessment: Ms. Connaughton is doing well overall. Denies pain or falls. Stays active and busy around the house. Reports doing exercises regularly. Especially in the mornings. Ms. Herrera endorses using Voltaren gel PRN and heating pad when she does have pain. Looking forward to using her Silver Sneakers membership at the [employer].   Interventions: Demonstrated Aging Gracefully home exercises and provided booklet. Ms. Cardozo followed along in booklet. Encouraged Ms. Samons to perform exercises throughout  the day. Discussed fall precautions.  Plan: Scheduled next home visit for January 20th at 1230 pm.    Pablo Hurst, MSN, RN, BSN [place]  [facility], [facility] Communities RN Case Manager for Aging Gracefully Direct Dial: [phone]     06/24/24 AG RN visit #3  Assessment: Ms. Pentland rates knee pain 7 out of 10. Denies any falls. Reports going to [facility] as benefit of her Silver Sneakers membership thru insurance one to two times a week. States the heated pool helps her knee pain tremendously. She is looking forward to going to the [facility]  today after AG RN visit.  Reports changing her PCP to [facility] provider Candis, NP. States she is pleased with her new provider. Reports having recent PCP appointment. States she also received a flu shot. Ms. Gamero endorses also performing home exercises. Ms. Feinberg endorses using cane as needed.   Interventions: Discussed fall precautions. Complimented Ms. Marina for her going to the [facility] and exercising consistently. Encouraged Ms. Beckers to stay hydrated. Encouraged Ms. Artiga to utilize rug grippers on area rugs if she does not take them up. Discussed area rugs are a trip hazard.   Plan: Scheduled final AG RN home visit for February 19th at 1230 pm.   Pablo Hurst, MSN, RN, BSN [place]  [facility], [facility] Communities RN Case Manager for Aging Gracefully Direct Dial: [phone]

## 2025-01-28 ENCOUNTER — Ambulatory Visit: Admitting: Endocrinology
